# Patient Record
Sex: Male | Born: 1975 | Race: White | Hispanic: No | Marital: Married | Smoking: Never smoker
Health system: Southern US, Community
[De-identification: ages and names within clinical notes are randomized; demographics above are authoritative.]

## PROBLEM LIST (undated history)

## (undated) HISTORY — PX: OTHER SURGICAL HISTORY: SHX169

---

## 2011-09-01 ENCOUNTER — Emergency Department (HOSPITAL_COMMUNITY)
Admission: EM | Admit: 2011-09-01 | Discharge: 2011-09-01 | Disposition: A | Discharge status: Home / Self Care | Payer: BC Managed Care – PPO | Attending: Emergency Medicine | Admitting: Emergency Medicine

## 2011-09-01 ENCOUNTER — Ambulatory Visit (INDEPENDENT_AMBULATORY_CARE_PROVIDER_SITE_OTHER): Payer: BC Managed Care – PPO

## 2011-09-01 ENCOUNTER — Emergency Department (HOSPITAL_COMMUNITY): Payer: BC Managed Care – PPO

## 2011-09-01 ENCOUNTER — Encounter (HOSPITAL_COMMUNITY): Payer: Self-pay | Admitting: Emergency Medicine

## 2011-09-01 DIAGNOSIS — Y9239 Other specified sports and athletic area as the place of occurrence of the external cause: Secondary | ICD-10-CM | POA: Insufficient documentation

## 2011-09-01 DIAGNOSIS — Y92838 Other recreation area as the place of occurrence of the external cause: Secondary | ICD-10-CM | POA: Insufficient documentation

## 2011-09-01 DIAGNOSIS — IMO0002 Reserved for concepts with insufficient information to code with codable children: Secondary | ICD-10-CM | POA: Insufficient documentation

## 2011-09-01 DIAGNOSIS — S0990XA Unspecified injury of head, initial encounter: Secondary | ICD-10-CM

## 2011-09-01 DIAGNOSIS — S0101XA Laceration without foreign body of scalp, initial encounter: Secondary | ICD-10-CM

## 2011-09-01 DIAGNOSIS — S0083XA Contusion of other part of head, initial encounter: Secondary | ICD-10-CM

## 2011-09-01 DIAGNOSIS — S0100XA Unspecified open wound of scalp, initial encounter: Secondary | ICD-10-CM | POA: Insufficient documentation

## 2011-09-01 DIAGNOSIS — Y998 Other external cause status: Secondary | ICD-10-CM | POA: Insufficient documentation

## 2011-09-01 MED ORDER — HYDROCODONE-ACETAMINOPHEN 5-500 MG PO TABS: 1.0000 | ORAL_TABLET | Freq: Four times a day (QID) | ORAL | Status: AC | PRN

## 2011-09-01 MED ORDER — TETANUS-DIPHTH-ACELL PERTUSSIS 5-2.5-18.5 LF-MCG/0.5 IM SUSP
0.5000 mL | Freq: Once | INTRAMUSCULAR | Status: AC
  Administered 2011-09-01: 0.5 mL via INTRAMUSCULAR
  Filled 2011-09-01: qty 0.5

## 2011-09-01 NOTE — ED Notes (Signed)
Per EMS pt transported from [facility] after hitting head on metal bar, significant lac noted just into hairline. Bleeding controlled. Pt denies LOC, denies n/v.

## 2011-09-01 NOTE — ED Notes (Signed)
Pt ambulatory to CT with steady gait.

## 2011-09-01 NOTE — ED Provider Notes (Signed)
History     CSN: [id number]  Arrival date & time 09/01/11  1612   First MD Initiated Contact with Patient 09/01/11 1650      Chief Complaint  Patient presents with  . Head Laceration    (Consider location/radiation/quality/duration/timing/severity/associated sxs/prior treatment) Patient is a 36 y.o. male presenting with head injury. The history is provided by the patient.  Head Injury  The incident occurred 3 to 5 hours ago. He came to the ER via EMS. The injury mechanism was a direct blow. There was no loss of consciousness. The volume of blood lost was minimal. The quality of the pain is described as dull. The pain is at a severity of 7/10. Pertinent negatives include no numbness and no weakness.  States was playing at a playground with his sone when he hit his head on a monkey bar when stood up. States at that time, severe headache, blurred vision for 2 seconds, laceration and bleeding to the top of the head. Pt States his wife applied dressing that they happened to have in their first aid kit in the car and went to [facility]. At urgent care, pt's injury seemed complex and he was sent here for CT and wound repair. Pt states by the time he got here, his headache is improving, denies visual changes, nausea, vomiting, dizziness. Laceration hemostatic.   History reviewed. No pertinent past medical history.  History reviewed. No pertinent past surgical history.  History reviewed. No pertinent family history.  History  Substance Use Topics  . Smoking status: Never Smoker   . Smokeless tobacco: Not on file  . Alcohol Use: Yes     occasional      Review of Systems  Constitutional: Negative for fever and chills.  Eyes: Negative for visual disturbance.  Respiratory: Negative.   Cardiovascular: Negative.   Gastrointestinal: Negative.   Genitourinary: Negative.   Musculoskeletal: Negative.   Skin: Negative.   Neurological: Positive for headaches. Negative for dizziness, seizures,  syncope, weakness and numbness.  Psychiatric/Behavioral: Negative.     Allergies  Review of patient's allergies indicates no known allergies.  Home Medications  No current outpatient prescriptions on file.  BP 132/74  Pulse 91  Temp(Src) 98.1 F (36.7 C) (Oral)  Resp 16  SpO2 100%  Physical Exam  Nursing note and vitals reviewed. Constitutional: He is oriented to person, place, and time. He appears well-developed and well-nourished. No distress.  HENT:  Head: Normocephalic.  Right Ear: External ear normal.  Left Ear: External ear normal.  Nose: Nose normal.       15cm flap laceration to the top of the scalp, just behind the hair line, hemostatic  Eyes: Conjunctivae and EOM are normal. Pupils are equal, round, and reactive to light.  Neck: Normal range of motion. Neck supple.  Cardiovascular: Normal rate, regular rhythm and normal heart sounds.   Pulmonary/Chest: Effort normal and breath sounds normal. No respiratory distress.  Abdominal: Soft. Bowel sounds are normal. He exhibits no distension.  Musculoskeletal: Normal range of motion.  Neurological: He is alert and oriented to person, place, and time. He has normal reflexes. No cranial nerve deficit. He exhibits normal muscle tone. Coordination normal.  Skin: Skin is warm and dry.  Psychiatric: He has a normal mood and affect.    ED Course  Procedures (including critical care time)  CT head obtained and is negative. Laceration repaired with stapling after irrigating with spray and syringe with 1L of NS and safe clense. Pt states pain is  minimal. He is ambulatory. Will d/c home.   No results found for this or any previous visit. Ct Head Wo Contrast  09/01/2011  *RADIOLOGY REPORT*  Clinical Data: Blow to the top of the head, laceration.  CT HEAD WITHOUT CONTRAST  Technique:  Contiguous axial images were obtained from the base of the skull through the vertex without contrast.  Comparison: None.  Findings: Laceration is  identified over the frontal bone.  No underlying fracture or radiopaque foreign body is identified.  The brain appears normal without evidence of acute infarction, hemorrhage, mass lesion, mass effect, midline shift or abnormal extra-axial fluid collection.  No hydrocephalus or pneumocephalus. There is some ethmoid air cell disease on the right.  IMPRESSION: Scalp laceration without underlying fracture.  No acute intracranial abnormality.  Original Report Authenticated By: Bernadene Bell. Maricela Curet, M.D.    LACERATION REPAIR Performed by: Lottie Mussel Authorized by: Jaynie Crumble A Consent: Verbal consent obtained. Risks and benefits: risks, benefits and alternatives were discussed Consent given by: patient Patient identity confirmed: provided demographic data Prepped and Draped in normal sterile fashion Wound explored  Laceration Location: top of the scalp  Laceration Length: 15cm  No Foreign Bodies seen or palpated  Anesthesia: local infiltration  Local anesthetic: lidocaine 2% w/ epinephrine  Anesthetic total: 6 ml  Irrigation method: syringe Amount of cleaning: extensive  Skin closure: Staples  Number of staples: 21    Patient tolerance: Patient tolerated the procedure well with no immediate complications.     MDM          Lottie Mussel, PA 09/01/11 2001  Lottie Mussel, PA 09/01/11 2001

## 2011-09-01 NOTE — ED Notes (Signed)
Suture Cart is at bedside. Pt. Has no needs at this time.

## 2011-09-01 NOTE — ED Notes (Signed)
PA at bedside for suturing 

## 2011-09-01 NOTE — ED Notes (Signed)
[facility]. Pt states @ 1430 he was running on playground with son, pt attempted to run under metal bar, hitting top of head on bar. U shaped laceration to scalp, bleeding controlled. Denies LOC.

## 2011-09-04 NOTE — ED Provider Notes (Signed)
Medical screening examination/treatment/procedure(s) were performed by non-physician practitioner and as supervising physician I was immediately available for consultation/collaboration.   Shelda Jakes, MD 09/04/11 2055

## 2011-09-11 ENCOUNTER — Ambulatory Visit (INDEPENDENT_AMBULATORY_CARE_PROVIDER_SITE_OTHER): Payer: BC Managed Care – PPO | Admitting: Internal Medicine

## 2011-09-11 VITALS — BP 108/70 | HR 72 | Temp 98.1°F | Resp 16 | Ht 69.0 in | Wt 157.0 lb

## 2011-09-11 DIAGNOSIS — S0100XA Unspecified open wound of scalp, initial encounter: Secondary | ICD-10-CM | POA: Insufficient documentation

## 2011-09-11 NOTE — Progress Notes (Signed)
pat Subjective:    Patient ID: Dale Webster, male    DOB: [birth year], 36 y.o.   MRN: [id number]  Suture / Staple Removal The sutures were placed 7 to 10 days ago. He tried antibiotic ointment use since the wound repair. The treatment provided significant relief. His temperature was unmeasured prior to arrival. There has been no drainage from the wound. There is no redness present. There is no swelling present. The pain has improved. He has no difficulty moving the affected extremity or digit.      Review of Systems  Constitutional: Negative.   HENT: Negative.   Eyes: Negative.   Respiratory: Negative.   Skin: Positive for wound.       Objective:   Physical Exam  Constitutional: He appears well-developed and well-nourished.  HENT:  Head: Normocephalic.  Eyes: Pupils are equal, round, and reactive to light.  Neck: Neck supple.  Skin: Skin is warm and dry.          Assessment & Plan:  Scalp wound itches at times, but no significant pain.  Mr. Burley states he never took Vicodin for pain.  He has struck his head twice since his injury, once a small amount of bleeding occurred in the middle portion of his wound.  It remains tender if touched but pain has gradually decreased everyday.

## 2011-09-11 NOTE — Patient Instructions (Signed)
Patient advised to very gently wash area with shampoo and warm water when bathing.  Avoid any re-injury.  RTC prn.

## 2011-10-10 ENCOUNTER — Encounter: Payer: Self-pay | Admitting: *Deleted

## 2011-10-11 ENCOUNTER — Ambulatory Visit: Payer: BC Managed Care – PPO | Admitting: Family Medicine

## 2011-10-25 ENCOUNTER — Ambulatory Visit: Payer: BC Managed Care – PPO | Admitting: Family Medicine

## 2011-11-22 ENCOUNTER — Encounter: Payer: Self-pay | Admitting: Family Medicine

## 2011-11-22 ENCOUNTER — Ambulatory Visit (INDEPENDENT_AMBULATORY_CARE_PROVIDER_SITE_OTHER): Payer: BC Managed Care – PPO | Admitting: Family Medicine

## 2011-11-22 VITALS — BP 114/66 | HR 74 | Temp 97.2°F | Resp 16 | Ht 68.0 in | Wt 150.8 lb

## 2011-11-22 DIAGNOSIS — Z0000 Encounter for general adult medical examination without abnormal findings: Secondary | ICD-10-CM

## 2011-11-22 DIAGNOSIS — B360 Pityriasis versicolor: Secondary | ICD-10-CM

## 2011-11-22 DIAGNOSIS — D229 Melanocytic nevi, unspecified: Secondary | ICD-10-CM

## 2011-11-22 DIAGNOSIS — Z79899 Other long term (current) drug therapy: Secondary | ICD-10-CM

## 2011-11-22 MED ORDER — KETOCONAZOLE 200 MG PO TABS: 400.0000 mg | ORAL_TABLET | Freq: Once | ORAL | Status: AC

## 2011-11-22 NOTE — Progress Notes (Signed)
  Subjective:    Patient ID: Dale Webster, male    DOB: March 03, 1976, 36 y.o.   MRN: [id number]  HPI Dale Webster is a 36 y.o. male  CPE - see PHS for details. Feels pretty healthy.   Hx of moles in past biopsied, as sister with hx melanoma.   Last derm eval 1 and 1.2 years ago.  No new moles.  Hx of tinea versicolor - initially on arms 2 years ago, now on more areas- back chest, neck, shoulders past 6- 8 months.  Tried otc selsun blue.  Energy auditor. NOnsmoker.  No other med problems.  Notices toenaill fungus for years. no treatments.   Review of Systems See scanned copy of PHS - 13 point ROS on PHS.     Objective:   Physical Exam  Constitutional: He is oriented to person, place, and time. He appears well-developed and well-nourished.  HENT:  Head: Normocephalic and atraumatic.  Right Ear: External ear normal.  Left Ear: External ear normal.  Mouth/Throat: Oropharynx is clear and moist.  Eyes: Conjunctivae and EOM are normal. Pupils are equal, round, and reactive to light.  Neck: Normal range of motion. Neck supple. No thyromegaly present.  Cardiovascular: Normal rate, regular rhythm, normal heart sounds and intact distal pulses.   Pulmonary/Chest: Effort normal and breath sounds normal. No respiratory distress. He has no wheezes.  Abdominal: Soft. He exhibits no distension. There is no tenderness. Hernia confirmed negative in the right inguinal area and confirmed negative in the left inguinal area.  Genitourinary: Penis normal. Right testis shows no mass. Left testis shows no mass.  Musculoskeletal: Normal range of motion. He exhibits no edema and no tenderness.  Lymphadenopathy:    He has no cervical adenopathy.  Neurological: He is alert and oriented to person, place, and time. He has normal reflexes.  Skin: Skin is warm and dry.          Diffuse hyperpigmented patches - shoulders, arms, trunk.    Psychiatric: He has a normal mood and affect. His behavior is normal.           Assessment & Plan:  [name] is a 36 y.o. male Annual exam - no acute findings;  Check CMP, lipids, CBC.  Plan on PSA at 36yo with FH prostate CA, see scanned form.  tinea versicolor - Rx Nizoral 200mg  - 2 po x 1, sweat with exercise 1 hour after taking med, then wait to shower for 1-2hours.  rf x 1 to repeat in 3-4 weeks if not improved.  Phone numbers for dentist and optho.  Refer to derm with FH melanoma.

## 2011-11-22 NOTE — Patient Instructions (Addendum)
Dentistry:  [facility] [phone]          Opthalmology: [facility] [phone]                                Dr.Groat  [phone]                                Dr. Hazle Quant [phone]    Peds Dentist: Dr Nicholes Rough [address] Sherian Maroon  [phone]        We will refer you to dermatology.  Take Nizoral - 2 pills at once, sweat with exercise 1 hour after taking med, then wait to shower for 1-2hours. Can repeat dose in 3-4 weeks if needed.  .Return to the clinic or go to the nearest emergency room if any of your symptoms worsen or new symptoms occur.

## 2011-11-23 LAB — CBC
HCT: 41.6 % (ref 39.0–52.0)
Hemoglobin: 13.7 g/dL (ref 13.0–17.0)
RBC: 4.81 MIL/uL (ref 4.22–5.81)
WBC: 5.5 10*3/uL (ref 4.0–10.5)

## 2011-11-23 LAB — COMPREHENSIVE METABOLIC PANEL
ALT: 30 U/L (ref 0–53)
AST: 30 U/L (ref 0–37)
Albumin: 4.9 g/dL (ref 3.5–5.2)
Alkaline Phosphatase: 58 U/L (ref 39–117)
Glucose, Bld: 78 mg/dL (ref 70–99)
Potassium: 3.6 mEq/L (ref 3.5–5.3)
Sodium: 141 mEq/L (ref 135–145)
Total Protein: 6.7 g/dL (ref 6.0–8.3)

## 2011-11-23 LAB — LIPID PANEL
LDL Cholesterol: 76 mg/dL (ref 0–99)
VLDL: 8 mg/dL (ref 0–40)

## 2013-02-23 IMAGING — CT CT HEAD W/O CM
2 series · 17 of 30 positions shown, 20 images · non-contrast
Comparison: None.

CLINICAL DATA: Blow to the top of the head, laceration.

CT HEAD WITHOUT CONTRAST
TECHNIQUE: Contiguous axial images were obtained from the base of
the skull through the vertex without contrast.

[Series 2: head w/o · axial · non-contrast · 0.47mm/px · z∈[+1090,+1210]mm · 9 of 31 slices shown, 12 images]
[im 4/31  brain]
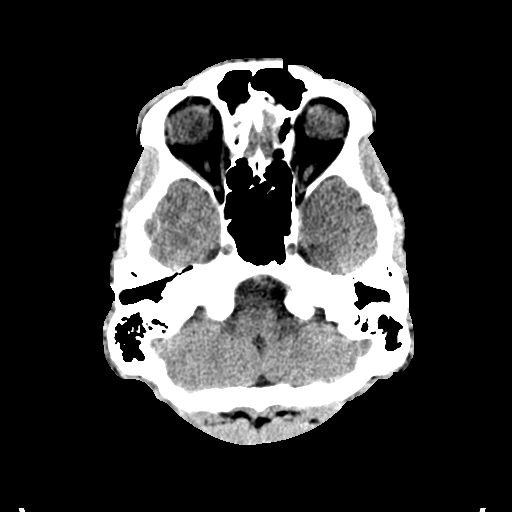
[im 4/31  bone]
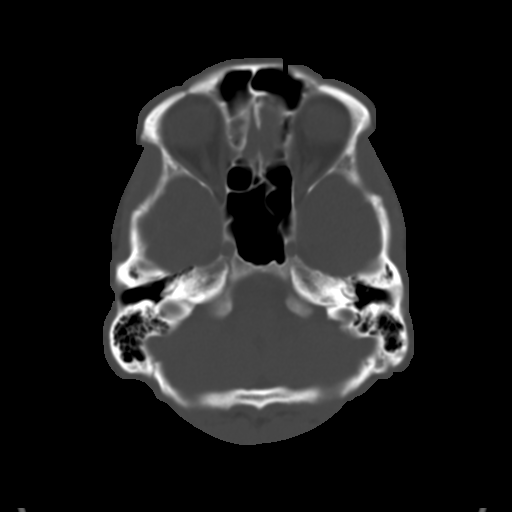
[im 7/31  brain]
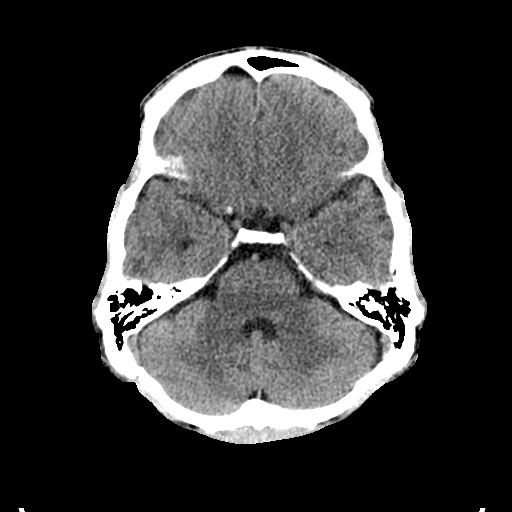
[im 10/31  brain]
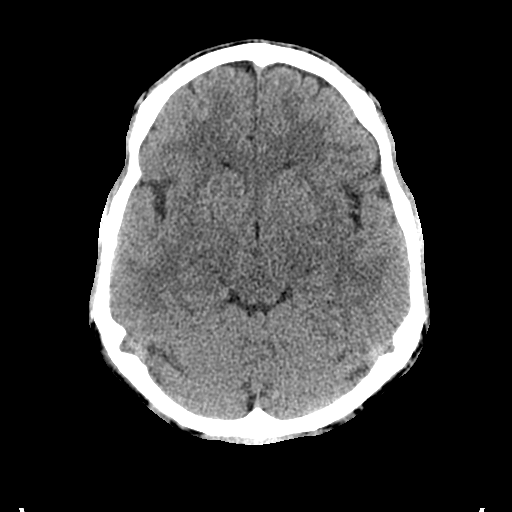
[im 13/31  brain]
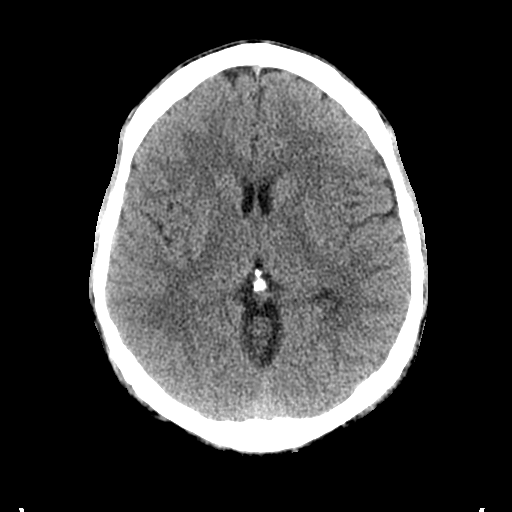
[im 16/31  brain]
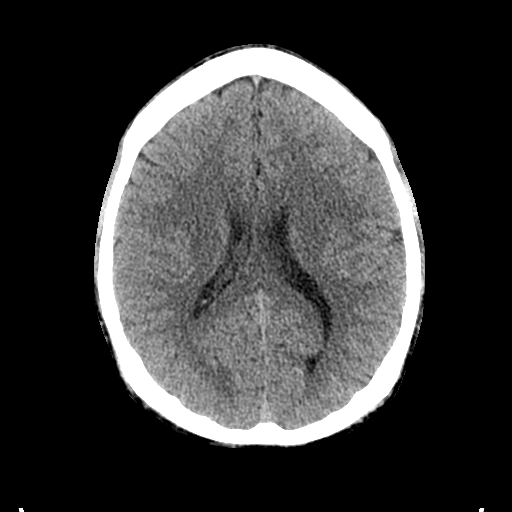
[im 16/31  bone]
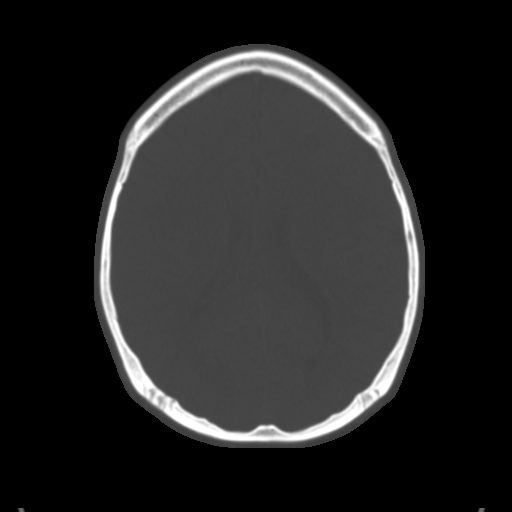
[im 19/31  brain]
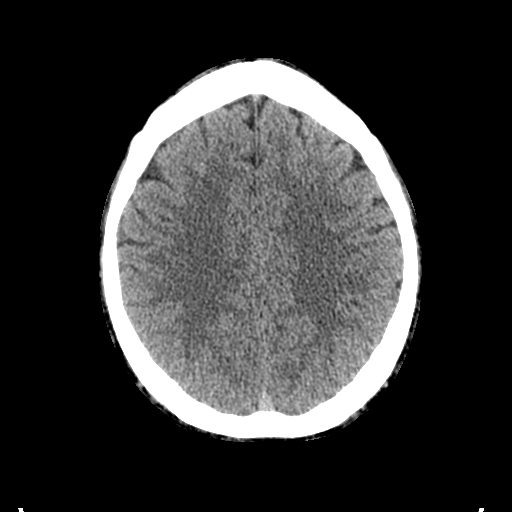
[im 22/31  brain]
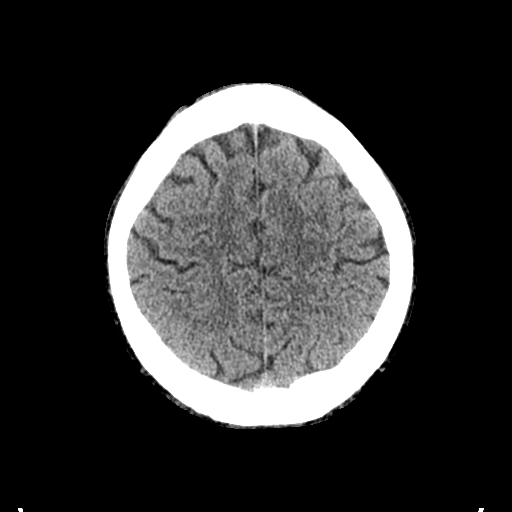
[im 25/31  brain]
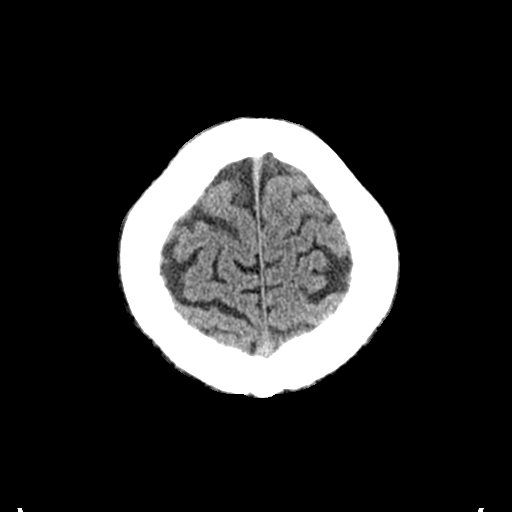
[im 28/31  brain]
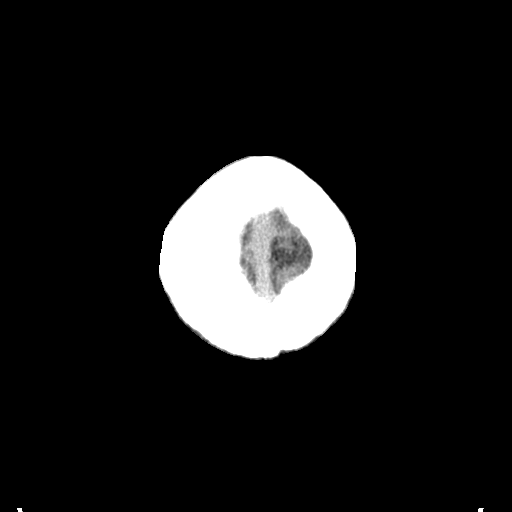
[im 28/31  bone]
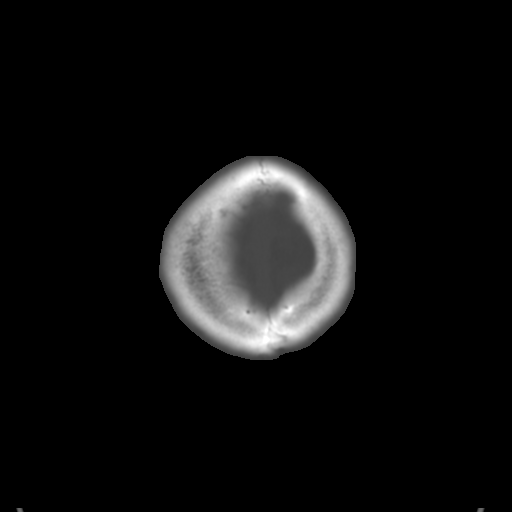

[Series 3: bone windows · axial · 0.47mm/px · z∈[+1090,+1210]mm · 8 of 52 slices shown]
[im 6/52  bone]
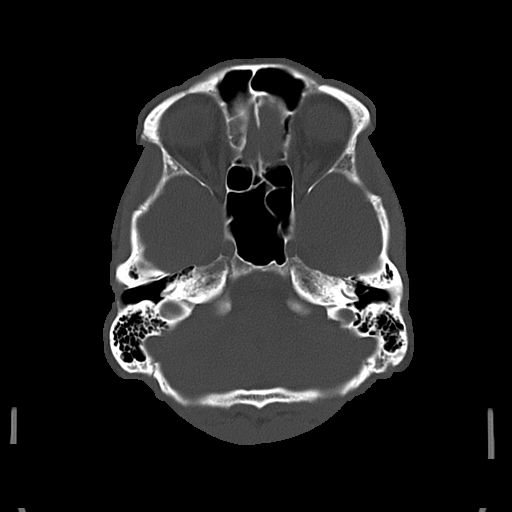
[im 12/52  bone]
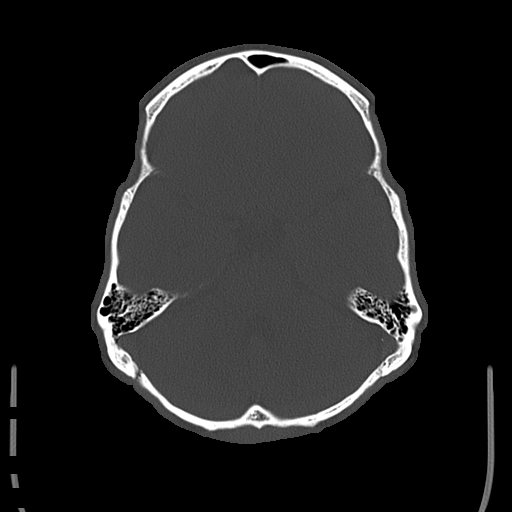
[im 18/52  bone]
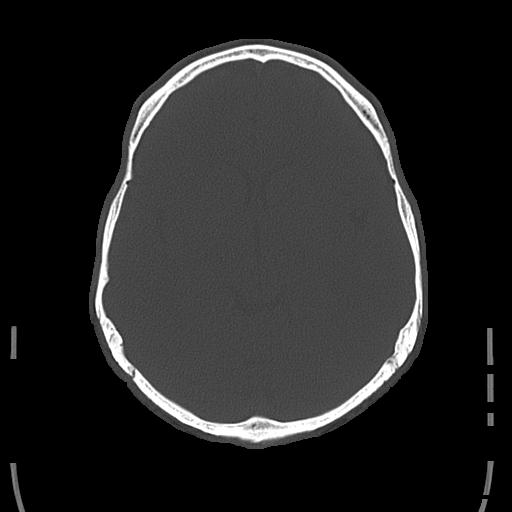
[im 23/52  bone]
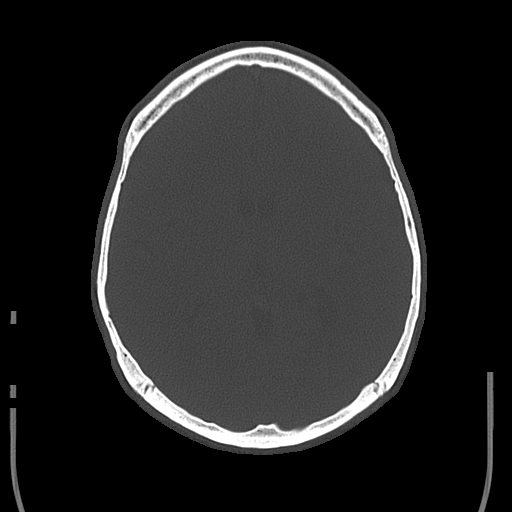
[im 29/52  bone]
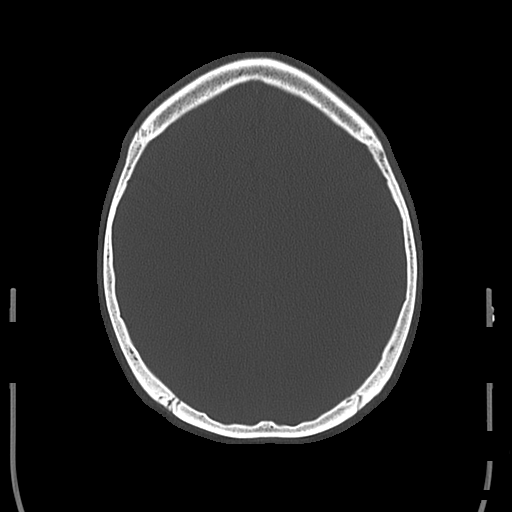
[im 35/52  bone]
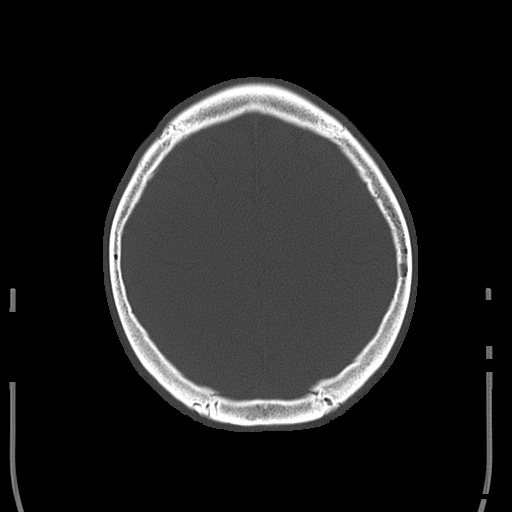
[im 40/52  bone]
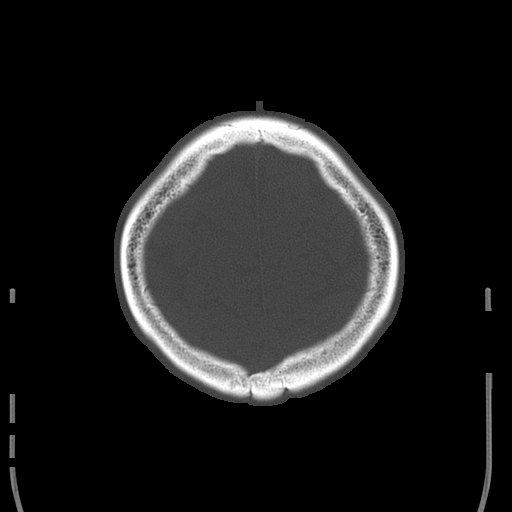
[im 46/52  bone]
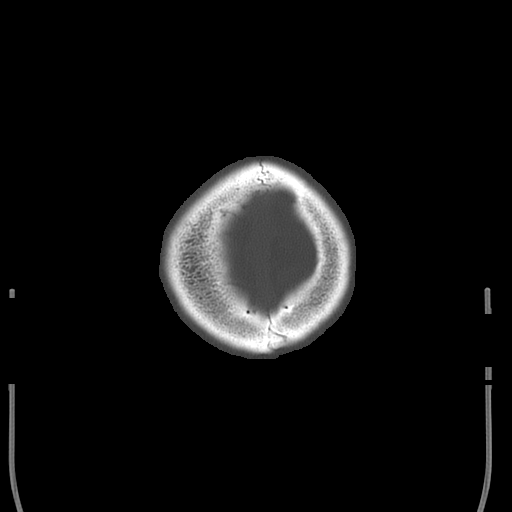

[17 of 30 positions shown; findings below may reference images not displayed]

FINDINGS: Laceration is identified over the frontal bone.  No
underlying fracture or radiopaque foreign body is identified.  The
brain appears normal without evidence of acute infarction,
hemorrhage, mass lesion, mass effect, midline shift or abnormal
extra-axial fluid collection.  No hydrocephalus or pneumocephalus.
There is some ethmoid air cell disease on the right.
IMPRESSION: Scalp laceration without underlying fracture.  No acute
intracranial abnormality.

## 2016-07-26 ENCOUNTER — Ambulatory Visit (INDEPENDENT_AMBULATORY_CARE_PROVIDER_SITE_OTHER): Payer: BLUE CROSS/BLUE SHIELD | Admitting: Family Medicine

## 2016-07-26 VITALS — BP 104/54 | HR 72 | Temp 98.2°F | Resp 16 | Ht 68.0 in | Wt 157.0 lb

## 2016-07-26 DIAGNOSIS — Z13220 Encounter for screening for lipoid disorders: Secondary | ICD-10-CM | POA: Diagnosis not present

## 2016-07-26 DIAGNOSIS — Z0000 Encounter for general adult medical examination without abnormal findings: Secondary | ICD-10-CM

## 2016-07-26 DIAGNOSIS — L729 Follicular cyst of the skin and subcutaneous tissue, unspecified: Secondary | ICD-10-CM | POA: Diagnosis not present

## 2016-07-26 DIAGNOSIS — F458 Other somatoform disorders: Secondary | ICD-10-CM

## 2016-07-26 DIAGNOSIS — R0989 Other specified symptoms and signs involving the circulatory and respiratory systems: Secondary | ICD-10-CM

## 2016-07-26 NOTE — Progress Notes (Signed)
Subjective:  By signing my name below, I, Essence Howell, attest that this documentation has been prepared under the direction and in the presence of Wendie Agreste, MD Electronically Signed: Ladene Artist, ED Scribe 07/26/2016 at 9:10 AM   Patient ID: Dale Webster, male    DOB: [birth year], 40 y.o.   MRN: [id number]  Chief Complaint  Patient presents with  . Annual Exam   HPI HPI Comments: Dale Webster is a 40 y.o. male who presents to the [facility] for an annual exam. Last CPE was April 2013.   Pt states that he was experiencing an "electrical pulse" that occurred every 10 seconds in his left leg. He states that it felt like the pulse was occurring in his muscle but resolved yesterday.   Pt also presents with a slightly enlarged bump in his scalp first noticed 8-10 years but has slightly shifted upward. Pt states that he has not seen his dermatologist, Lennie Odor with [facility], in a while.   Pt also reports occasional globus sensation that occurs more often while eating fast and eating carbohydrates. He states that he occasionally has noticed the sensation even after reducing his speed. He describes the globus sensation as non-radiating and states that he last experienced it approximately 6 months ago accompanied with some nausea. Pt denies diaphoresis and vomiting. No family h/o heart disease.   CA Screening Had moles biopsied previously as his sister has had melanoma. Father has a h/o prostate CA at 27 y.o.  Immunizations Immunization History  Administered Date(s) Administered  . Tdap 09/01/2011  Pt has not received a flu vaccine this season.   Depression Screening  Depression screen [facility] 2/9 07/26/2016  Decreased Interest 0  Down, Depressed, Hopeless 0  PHQ - 2 Score 0   Vision Screening  Visual Acuity Screening   Right eye Left eye Both eyes  Without correction:     With correction: 20/15 20/15 20/15   Pt last had an eye exam last  year.   He is UTD on his dental exam.  Exercise Pt walks his dog at least twice daily and reports going up and down a ladder often at work.   STD Screening Pt declined STD screening at this visit.   Lipid Screening Lab Results  Component Value Date   CHOL 131 11/22/2011   HDL 47 11/22/2011   LDLCALC 76 11/22/2011   TRIG 39 11/22/2011   CHOLHDL 2.8 11/22/2011   Patient Active Problem List   Diagnosis Date Noted  . Scalp wound 09/11/2011   History reviewed. No pertinent past medical history. Past Surgical History:  Procedure Laterality Date  . mole biopsy     benign   Not on File Prior to Admission medications   Not on File   Social History   Social History  . Marital status: Married    Spouse name: N/A  . Number of children: N/A  . Years of education: N/A   Occupational History  . energy analyst    Social History Main Topics  . Smoking status: Never Smoker  . Smokeless tobacco: Never Used  . Alcohol use Yes     Comment: occasional  . Drug use: No  . Sexual activity: Not on file   Other Topics Concern  . Not on file   Social History Narrative  . No narrative on file   Review of Systems  All other systems reviewed and are negative.     Objective:   Physical  Exam  Constitutional: He is oriented to person, place, and time. He appears well-developed and well-nourished.  HENT:  Head: Normocephalic and atraumatic.  Right Ear: External ear normal.  Left Ear: External ear normal.  Mouth/Throat: Oropharynx is clear and moist.  Mobile cystic structure at the posterior R scalp.   Eyes: Conjunctivae and EOM are normal. Pupils are equal, round, and reactive to light.  Neck: Normal range of motion. Neck supple. No thyromegaly present.  Cardiovascular: Normal rate, regular rhythm, normal heart sounds and intact distal pulses.   Pulmonary/Chest: Effort normal and breath sounds normal. No respiratory distress. He has no wheezes.  Abdominal: Soft. He exhibits no  distension. There is no tenderness.  Musculoskeletal: Normal range of motion. He exhibits no edema or tenderness.  Lymphadenopathy:    He has no cervical adenopathy.  Neurological: He is alert and oriented to person, place, and time. He has normal reflexes.  Skin: Skin is warm and dry.  Multiple scatted nevi on back and upper chest wall.   Psychiatric: He has a normal mood and affect. His behavior is normal.  Vitals reviewed.  Vitals:   07/26/16 0854  BP: (!) 104/54  Pulse: 72  Resp: 16  Temp: 98.2 F (36.8 C)  TempSrc: Oral  SpO2: 100%  Weight: 157 lb (71.2 kg)  Height: 5' 8"  (1.727 m)      Assessment & Plan:   Dale Webster is a 40 y.o. male Annual physical exam  --anticipatory guidance as below in AVS, screening labs above. Health maintenance items as above in HPI discussed/recommended as applicable.   -Consider PSA/prostate screening between 16 and 60 given family history of prostate cancer.  -Previous leg symptoms may have been meralgia paresthetica, but asymptomatic currently. Return if symptoms recur.   Screening for hyperlipidemia - Plan: Comprehensive metabolic panel, Lipid panel  Globus sensation  - Possible reflux versus eating too fast. Avoidance of trigger foods, then RTC precautions if persistent symptoms. Sooner if worse.  Scalp cyst  -Follow-up with dermatologist for routine skin check with family history of skin cancer, and they can also evaluate scalp cyst for possible excision.  No orders of the defined types were placed in this encounter.  Patient Instructions   See foods to avoid below that may increase heartburn symptoms. If you experience these symptoms in the chest again, return to discuss further.  Return to the clinic or go to the nearest emergency room if any of your symptoms worsen or new symptoms occur.  I will check some electrolytes for the spasm or sensation you had in your leg, but no concerns on exam today.  Call your dermatologist, at  [facility] for appointment. They can also look at the cyst on your scalp to discuss removal.  We can discuss prostate cancer screening starting either next year or age 3 due to your family history.  Return to the clinic or go to the nearest emergency room if any of your symptoms worsen or new symptoms occur.  Food Choices for Gastroesophageal Reflux Disease, Adult When you have gastroesophageal reflux disease (GERD), the foods you eat and your eating habits are very important. Choosing the right foods can help ease the discomfort of GERD. What general guidelines do I need to follow?  Choose fruits, vegetables, whole grains, low-fat dairy products, and low-fat meat, fish, and poultry.  Limit fats such as oils, salad dressings, butter, nuts, and avocado.  Keep a food diary to identify foods that cause symptoms.  Avoid foods that  cause reflux. These may be different for different people.  Eat frequent small meals instead of three large meals each day.  Eat your meals slowly, in a relaxed setting.  Limit fried foods.  Cook foods using methods other than frying.  Avoid drinking alcohol.  Avoid drinking large amounts of liquids with your meals.  Avoid bending over or lying down until 2-3 hours after eating. What foods are not recommended? The following are some foods and drinks that may worsen your symptoms: Vegetables  Tomatoes. Tomato juice. Tomato and spaghetti sauce. Chili peppers. Onion and garlic. Horseradish. Fruits  Oranges, grapefruit, and lemon (fruit and juice). Meats  High-fat meats, fish, and poultry. This includes hot dogs, ribs, ham, sausage, salami, and bacon. Dairy  Whole milk and chocolate milk. Sour cream. Cream. Butter. Ice cream. Cream cheese. Beverages  Coffee and tea, with or without caffeine. Carbonated beverages or energy drinks. Condiments  Hot sauce. Barbecue sauce. Sweets/Desserts  Chocolate and cocoa. Donuts. Peppermint and  spearmint. Fats and Oils  High-fat foods, including [place] fries and potato chips. Other  Vinegar. Strong spices, such as black pepper, white pepper, red pepper, cayenne, curry powder, cloves, ginger, and chili powder. The items listed above may not be a complete list of foods and beverages to avoid. Contact your dietitian for more information.  This information is not intended to replace advice given to you by your health care provider. Make sure you discuss any questions you have with your health care provider. Document Released: 07/29/2005 Document Revised: 01/04/2016 Document Reviewed: 06/02/2013 Elsevier Interactive Patient Education  2017 [place] you healthy  Get these tests  Blood pressure- Have your blood pressure checked once a year by your healthcare provider.  Normal blood pressure is 120/80.  Weight- Have your body mass index (BMI) calculated to screen for obesity.  BMI is a measure of body fat based on height and weight. You can also calculate your own BMI at [employer]velBags.it.  Cholesterol- Have your cholesterol checked regularly starting at age 29, sooner may be necessary if you have diabetes, high blood pressure, if a family member developed heart diseases at an early age or if you smoke.   Chlamydia, HIV, and other sexual transmitted disease- Get screened each year until the age of 47 then within three months of each new sexual partner.  Diabetes- Have your blood sugar checked regularly if you have high blood pressure, high cholesterol, a family history of diabetes or if you are overweight.  Get these vaccines  Flu shot- Every fall.  Tetanus shot- Every 10 years.  Menactra- Single dose; prevents meningitis.  Take these steps  Don't smoke- If you do smoke, ask your healthcare provider about quitting. For tips on how to quit, go to www.smokefree.gov or call 1-800-QUIT-NOW.  Be physically active- Exercise 5 days a week for at least 30 minutes.   If you are not already physically active start slow and gradually work up to 30 minutes of moderate physical activity.  Examples of moderate activity include walking briskly, mowing the yard, dancing, swimming bicycling, etc.  Eat a healthy diet- Eat a variety of healthy foods such as fruits, vegetables, low fat milk, low fat cheese, yogurt, lean meats, poultry, fish, beans, tofu, etc.  For more information on healthy eating, go to www.thenutritionsource.org  Drink alcohol in moderation- Limit alcohol intake two drinks or less a day.  Never drink and drive.  Dentist- Brush and floss teeth twice daily; visit your dentis twice a year.  Depression-Your emotional health is as important as your physical health.  If you're feeling down, losing interest in things you normally enjoy please talk with your healthcare provider.  Gun Safety- If you keep a gun in your home, keep it unloaded and with the safety lock on.  Bullets should be stored separately.  Helmet use- Always wear a helmet when riding a motorcycle, bicycle, rollerblading or skateboarding.  Safe sex- If you may be exposed to a sexually transmitted infection, use a condom  Seat belts- Seat bels can save your life; always wear one.  Smoke/Carbon Monoxide detectors- These detectors need to be installed on the appropriate level of your home.  Replace batteries at least once a year.  Skin Cancer- When out in the sun, cover up and use sunscreen SPF 15 or higher.  Violence- If anyone is threatening or hurting you, please tell your healthcare provider.   IF you received an x-ray today, you will receive an invoice from [facility]. Please contact [facility] at [phone] with questions or concerns regarding your invoice.   IF you received labwork today, you will receive an invoice from [place]. Please contact [facility] at [phone] with questions or concerns regarding your invoice.   Our billing staff will not be able  to assist you with questions regarding bills from these companies.  You will be contacted with the lab results as soon as they are available. The fastest way to get your results is to activate your My Chart account. Instructions are located on the last page of this paperwork. If you have not heard from [place] regarding the results in 2 weeks, please contact this office.        I personally performed the services described in this documentation, which was scribed in my presence. The recorded information has been reviewed and considered, and addended by me as needed.   Signed,   Merri Ray, MD [facility].  07/27/16 8:07 PM

## 2016-07-26 NOTE — Patient Instructions (Addendum)
See foods to avoid below that may increase heartburn symptoms. If you experience these symptoms in the chest again, return to discuss further.  Return to the clinic or go to the nearest emergency room if any of your symptoms worsen or new symptoms occur.  I will check some electrolytes for the spasm or sensation you had in your leg, but no concerns on exam today.  Call your dermatologist, at [facility] for appointment. They can also look at the cyst on your scalp to discuss removal.  We can discuss prostate cancer screening starting either next year or age 39 due to your family history.  Return to the clinic or go to the nearest emergency room if any of your symptoms worsen or new symptoms occur.  Food Choices for Gastroesophageal Reflux Disease, Adult When you have gastroesophageal reflux disease (GERD), the foods you eat and your eating habits are very important. Choosing the right foods can help ease the discomfort of GERD. What general guidelines do I need to follow?  Choose fruits, vegetables, whole grains, low-fat dairy products, and low-fat meat, fish, and poultry.  Limit fats such as oils, salad dressings, butter, nuts, and avocado.  Keep a food diary to identify foods that cause symptoms.  Avoid foods that cause reflux. These may be different for different people.  Eat frequent small meals instead of three large meals each day.  Eat your meals slowly, in a relaxed setting.  Limit fried foods.  Cook foods using methods other than frying.  Avoid drinking alcohol.  Avoid drinking large amounts of liquids with your meals.  Avoid bending over or lying down until 2-3 hours after eating. What foods are not recommended? The following are some foods and drinks that may worsen your symptoms: Vegetables  Tomatoes. Tomato juice. Tomato and spaghetti sauce. Chili peppers. Onion and garlic. Horseradish. Fruits  Oranges, grapefruit, and lemon (fruit and juice). Meats   High-fat meats, fish, and poultry. This includes hot dogs, ribs, ham, sausage, salami, and bacon. Dairy  Whole milk and chocolate milk. Sour cream. Cream. Butter. Ice cream. Cream cheese. Beverages  Coffee and tea, with or without caffeine. Carbonated beverages or energy drinks. Condiments  Hot sauce. Barbecue sauce. Sweets/Desserts  Chocolate and cocoa. Donuts. Peppermint and spearmint. Fats and Oils  High-fat foods, including [place] fries and potato chips. Other  Vinegar. Strong spices, such as black pepper, white pepper, red pepper, cayenne, curry powder, cloves, ginger, and chili powder. The items listed above may not be a complete list of foods and beverages to avoid. Contact your dietitian for more information.  This information is not intended to replace advice given to you by your health care provider. Make sure you discuss any questions you have with your health care provider. Document Released: 07/29/2005 Document Revised: 01/04/2016 Document Reviewed: 06/02/2013 Elsevier Interactive Patient Education  2017 [place] you healthy  Get these tests  Blood pressure- Have your blood pressure checked once a year by your healthcare provider.  Normal blood pressure is 120/80.  Weight- Have your body mass index (BMI) calculated to screen for obesity.  BMI is a measure of body fat based on height and weight. You can also calculate your own BMI at [employer]velBags.it.  Cholesterol- Have your cholesterol checked regularly starting at age 70, sooner may be necessary if you have diabetes, high blood pressure, if a family member developed heart diseases at an early age or if you smoke.   Chlamydia, HIV, and other sexual transmitted disease- Get  screened each year until the age of 64 then within three months of each new sexual partner.  Diabetes- Have your blood sugar checked regularly if you have high blood pressure, high cholesterol, a family history of diabetes or if  you are overweight.  Get these vaccines  Flu shot- Every fall.  Tetanus shot- Every 10 years.  Menactra- Single dose; prevents meningitis.  Take these steps  Don't smoke- If you do smoke, ask your healthcare provider about quitting. For tips on how to quit, go to www.smokefree.gov or call 1-800-QUIT-NOW.  Be physically active- Exercise 5 days a week for at least 30 minutes.  If you are not already physically active start slow and gradually work up to 30 minutes of moderate physical activity.  Examples of moderate activity include walking briskly, mowing the yard, dancing, swimming bicycling, etc.  Eat a healthy diet- Eat a variety of healthy foods such as fruits, vegetables, low fat milk, low fat cheese, yogurt, lean meats, poultry, fish, beans, tofu, etc.  For more information on healthy eating, go to www.thenutritionsource.org  Drink alcohol in moderation- Limit alcohol intake two drinks or less a day.  Never drink and drive.  Dentist- Brush and floss teeth twice daily; visit your dentis twice a year.  Depression-Your emotional health is as important as your physical health.  If you're feeling down, losing interest in things you normally enjoy please talk with your healthcare provider.  Gun Safety- If you keep a gun in your home, keep it unloaded and with the safety lock on.  Bullets should be stored separately.  Helmet use- Always wear a helmet when riding a motorcycle, bicycle, rollerblading or skateboarding.  Safe sex- If you may be exposed to a sexually transmitted infection, use a condom  Seat belts- Seat bels can save your life; always wear one.  Smoke/Carbon Monoxide detectors- These detectors need to be installed on the appropriate level of your home.  Replace batteries at least once a year.  Skin Cancer- When out in the sun, cover up and use sunscreen SPF 15 or higher.  Violence- If anyone is threatening or hurting you, please tell your healthcare provider.   IF you  received an x-ray today, you will receive an invoice from [facility]. Please contact [facility] at [phone] with questions or concerns regarding your invoice.   IF you received labwork today, you will receive an invoice from [place]. Please contact [facility] at [phone] with questions or concerns regarding your invoice.   Our billing staff will not be able to assist you with questions regarding bills from these companies.  You will be contacted with the lab results as soon as they are available. The fastest way to get your results is to activate your My Chart account. Instructions are located on the last page of this paperwork. If you have not heard from [place] regarding the results in 2 weeks, please contact this office.

## 2016-07-27 LAB — LIPID PANEL
CHOLESTEROL TOTAL: 171 mg/dL (ref 100–199)
Chol/HDL Ratio: 2.9 ratio units (ref 0.0–5.0)
HDL: 60 mg/dL (ref >39)
LDL CALC: 102 mg/dL — AB (ref 0–99)
TRIGLYCERIDES: 45 mg/dL (ref 0–149)
VLDL CHOLESTEROL CAL: 9 mg/dL (ref 5–40)

## 2016-07-27 LAB — COMPREHENSIVE METABOLIC PANEL
ALK PHOS: 56 IU/L (ref 39–117)
ALT: 13 IU/L (ref 0–44)
AST: 18 IU/L (ref 0–40)
Albumin/Globulin Ratio: 2.8 — ABNORMAL HIGH (ref 1.2–2.2)
Albumin: 4.7 g/dL (ref 3.5–5.5)
BUN/Creatinine Ratio: 13 (ref 9–20)
BUN: 14 mg/dL (ref 6–20)
Bilirubin Total: 0.5 mg/dL (ref 0.0–1.2)
CALCIUM: 9.5 mg/dL (ref 8.7–10.2)
CO2: 23 mmol/L (ref 18–29)
CREATININE: 1.09 mg/dL (ref 0.76–1.27)
Chloride: 105 mmol/L (ref 96–106)
GFR calc Af Amer: 98 mL/min/{1.73_m2} (ref >59)
GFR calc non Af Amer: 85 mL/min/{1.73_m2} (ref >59)
GLUCOSE: 92 mg/dL (ref 65–99)
Globulin, Total: 1.7 g/dL (ref 1.5–4.5)
Potassium: 4.3 mmol/L (ref 3.5–5.2)
Sodium: 146 mmol/L — ABNORMAL HIGH (ref 134–144)
Total Protein: 6.4 g/dL (ref 6.0–8.5)

## 2016-08-08 ENCOUNTER — Encounter: Payer: Self-pay | Admitting: *Deleted

## 2020-02-03 ENCOUNTER — Encounter: Payer: Self-pay | Admitting: Family Medicine

## 2020-02-03 ENCOUNTER — Ambulatory Visit: Payer: Commercial Managed Care - PPO | Admitting: Family Medicine

## 2020-02-03 ENCOUNTER — Other Ambulatory Visit: Payer: Self-pay

## 2020-02-03 VITALS — BP 134/72 | HR 87 | Temp 97.9°F | Ht 68.0 in | Wt 156.0 lb

## 2020-02-03 DIAGNOSIS — Z8042 Family history of malignant neoplasm of prostate: Secondary | ICD-10-CM | POA: Diagnosis not present

## 2020-02-03 DIAGNOSIS — Z125 Encounter for screening for malignant neoplasm of prostate: Secondary | ICD-10-CM

## 2020-02-03 DIAGNOSIS — Z131 Encounter for screening for diabetes mellitus: Secondary | ICD-10-CM

## 2020-02-03 DIAGNOSIS — Z13220 Encounter for screening for lipoid disorders: Secondary | ICD-10-CM | POA: Diagnosis not present

## 2020-02-03 DIAGNOSIS — Z0000 Encounter for general adult medical examination without abnormal findings: Secondary | ICD-10-CM

## 2020-02-03 NOTE — Patient Instructions (Addendum)
Keeping you healthy  Get these tests  Blood pressure- Have your blood pressure checked once a year by your healthcare provider.  Normal blood pressure is 120/80.  Weight- Have your body mass index (BMI) calculated to screen for obesity.  BMI is a measure of body fat based on height and weight. You can also calculate your own BMI at [employer]ags.it.  Cholesterol- Have your cholesterol checked regularly starting at age 43, sooner may be necessary if you have diabetes, high blood pressure, if a family member developed heart diseases at an early age or if you smoke.   Chlamydia, HIV, and other sexual transmitted disease- Get screened each year until the age of 50 then within three months of each new sexual partner.  Diabetes- Have your blood sugar checked regularly if you have high blood pressure, high cholesterol, a family history of diabetes or if you are overweight.  Get these vaccines  Flu shot- Every fall.  Tetanus shot- Every 10 years.  Menactra- Single dose; prevents meningitis.  Take these steps  Don't smoke- If you do smoke, ask your healthcare provider about quitting. For tips on how to quit, go to www.smokefree.gov or call 1-800-QUIT-NOW.  Be physically active- Exercise 5 days a week for at least 30 minutes.  If you are not already physically active start slow and gradually work up to 30 minutes of moderate physical activity.  Examples of moderate activity include walking briskly, mowing the yard, dancing, swimming bicycling, etc.  Eat a healthy diet- Eat a variety of healthy foods such as fruits, vegetables, low fat milk, low fat cheese, yogurt, lean meats, poultry, fish, beans, tofu, etc.  For more information on healthy eating, go to www.thenutritionsource.org  Drink alcohol in moderation- Limit alcohol intake two drinks or less a day.  Never drink and drive.  Dentist- Brush and floss teeth twice daily; visit your dentis twice a year.  Depression-Your emotional  health is as important as your physical health.  If you're feeling down, losing interest in things you normally enjoy please talk with your healthcare provider.  Gun Safety- If you keep a gun in your home, keep it unloaded and with the safety lock on.  Bullets should be stored separately.  Helmet use- Always wear a helmet when riding a motorcycle, bicycle, rollerblading or skateboarding.  Safe sex- If you may be exposed to a sexually transmitted infection, use a condom  Seat belts- Seat bels can save your life; always wear one.  Smoke/Carbon Monoxide detectors- These detectors need to be installed on the appropriate level of your home.  Replace batteries at least once a year.  Skin Cancer- When out in the sun, cover up and use sunscreen SPF 15 or higher.  Violence- If anyone is threatening or hurting you, please tell your healthcare provider.    If you have lab work done today you will be contacted with your lab results within the next 2 weeks.  If you have not heard from [place] then please contact us. The fastest way to get your results is to register for My Chart.   IF you received an x-ray today, you will receive an invoice from [facility]. Please contact [facility] at [phone] with questions or concerns regarding your invoice.   IF you received labwork today, you will receive an invoice from [place]. Please contact [facility] at [phone] with questions or concerns regarding your invoice.   Our billing staff will not be able to assist you with questions regarding bills from  these companies.  You will be contacted with the lab results as soon as they are available. The fastest way to get your results is to activate your My Chart account. Instructions are located on the last page of this paperwork. If you have not heard from [place] regarding the results in 2 weeks, please contact this office.

## 2020-02-03 NOTE — Progress Notes (Signed)
Subjective:  Patient ID: Dale Webster, male    DOB: 09-20-1975  Age: 44 y.o. MRN: [id number]  CC:  Chief Complaint  Patient presents with  . Establish Care    as far as pt's general health pt reports he ffeels fine with no complaints. Pt isn't fasting.    HPI Dale Webster presents for   Scheduled to establish care, but I have seen him in the past, most recent visit December 2017 for physical. No specific concerns.  Cancer screening: FH: prostate CA - father at 60yo. Agrees to PSA only. Skin Ca - maternal GF, and sister with malignant melanoma.  DermDenna Haggard. appt - in August.   Immunization History  Administered Date(s) Administered  . Tdap 09/01/2011  covid vaccine - [employer]izer in March.   Depression screen [facility] 2/9 02/03/2020 07/26/2016  Decreased Interest 0 0  Down, Depressed, Hopeless 0 0  PHQ - 2 Score 0 0   No exam data present  Dental: Every 6 months, recent visit.   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 Climbing stairs, ladders. Over 12,000 steps per day.   Declines STI screening.    History Patient Active Problem List   Diagnosis Date Noted  . Scalp wound 09/11/2011   History reviewed. No pertinent past medical history. Past Surgical History:  Procedure Laterality Date  . mole biopsy     benign   No Known Allergies Prior to Admission medications   Not on File   Social History   Socioeconomic History  . Marital status: Married    Spouse name: Not on file  . Number of children: Not on file  . Years of education: Not on file  . Highest education level: Not on file  Occupational History  . Occupation: Leisure centre manager  Tobacco Use  . Smoking status: Never Smoker  . Smokeless tobacco: Never Used  Vaping Use  . Vaping Use: Never used  Substance and Sexual Activity  . Alcohol use: Yes    Alcohol/week: 15.0 - 20.0 standard drinks    Types: 15 - 20 Cans of beer per week    Comment: 2-3 a day  . Drug use: No  .  Sexual activity: Yes  Other Topics Concern  . Not on file  Social History Narrative  . Not on file   Social Determinants of Health   Financial Resource Strain:   . Difficulty of Paying Living Expenses:   Food Insecurity:   . Worried About Charity fundraiser in the Last Year:   . Arboriculturist in the Last Year:   Transportation Needs:   . Film/video editor (Medical):   Marland Kitchen Lack of Transportation (Non-Medical):   Physical Activity:   . Days of Exercise per Week:   . Minutes of Exercise per Session:   Stress:   . Feeling of Stress :   Social Connections:   . Frequency of Communication with Friends and Family:   . Frequency of Social Gatherings with Friends and Family:   . Attends Religious Services:   . Active Member of Clubs or Organizations:   . Attends Archivist Meetings:   Marland Kitchen Marital Status:   Intimate Partner Violence:   . Fear of Current or Ex-Partner:   . Emotionally Abused:   Marland Kitchen Physically Abused:   . Sexually Abused:     Review of Systems Per HPI. No concerns.   Objective:   Vitals:   02/03/20 1518  BP:  134/72  Pulse: 87  Temp: 97.9 F (36.6 C)  TempSrc: Temporal  SpO2: 97%  Weight: 156 lb (70.8 kg)  Height: 5' 8"  (1.727 m)     Physical Exam Vitals reviewed.  Constitutional:      Appearance: He is well-developed.  HENT:     Head: Normocephalic and atraumatic.     Right Ear: External ear normal.     Left Ear: External ear normal.  Eyes:     Conjunctiva/sclera: Conjunctivae normal.     Pupils: Pupils are equal, round, and reactive to light.  Neck:     Thyroid: No thyromegaly.  Cardiovascular:     Rate and Rhythm: Normal rate and regular rhythm.     Heart sounds: Normal heart sounds.  Pulmonary:     Effort: Pulmonary effort is normal. No respiratory distress.     Breath sounds: Normal breath sounds. No wheezing.  Abdominal:     General: There is no distension.     Palpations: Abdomen is soft.     Tenderness: There is no  abdominal tenderness.     Hernia: A hernia is present.  Musculoskeletal:        General: No tenderness. Normal range of motion.     Cervical back: Normal range of motion and neck supple.  Lymphadenopathy:     Cervical: No cervical adenopathy.  Skin:    General: Skin is warm and dry.  Neurological:     Mental Status: He is alert and oriented to person, place, and time.     Deep Tendon Reflexes: Reflexes are normal and symmetric.  Psychiatric:        Behavior: Behavior normal.        Assessment & Plan:  Dale Webster is a 44 y.o. male . Annual physical exam  - -anticipatory guidance as below in AVS, screening labs above. Health maintenance items as above in HPI discussed/recommended as applicable.   Screening for hyperlipidemia - Plan: Lipid panel  Screening for diabetes mellitus - Plan: Comprehensive metabolic panel  Family history of prostate cancer in father - Plan: PSA Screening for prostate cancer  - We discussed pros and cons of prostate cancer screening, and after this discussion, he chose to have screening done with PSA obtained  No orders of the defined types were placed in this encounter.  Patient Instructions    Keeping you healthy  Get these tests  Blood pressure- Have your blood pressure checked once a year by your healthcare provider.  Normal blood pressure is 120/80.  Weight- Have your body mass index (BMI) calculated to screen for obesity.  BMI is a measure of body fat based on height and weight. You can also calculate your own BMI at [employer]velBags.it.  Cholesterol- Have your cholesterol checked regularly starting at age 80, sooner may be necessary if you have diabetes, high blood pressure, if a family member developed heart diseases at an early age or if you smoke.   Chlamydia, HIV, and other sexual transmitted disease- Get screened each year until the age of 45 then within three months of each new sexual partner.  Diabetes- Have your blood  sugar checked regularly if you have high blood pressure, high cholesterol, a family history of diabetes or if you are overweight.  Get these vaccines  Flu shot- Every fall.  Tetanus shot- Every 10 years.  Menactra- Single dose; prevents meningitis.  Take these steps  Don't smoke- If you do smoke, ask your healthcare provider about quitting. For tips on how  to quit, go to www.smokefree.gov or call 1-800-QUIT-NOW.  Be physically active- Exercise 5 days a week for at least 30 minutes.  If you are not already physically active start slow and gradually work up to 30 minutes of moderate physical activity.  Examples of moderate activity include walking briskly, mowing the yard, dancing, swimming bicycling, etc.  Eat a healthy diet- Eat a variety of healthy foods such as fruits, vegetables, low fat milk, low fat cheese, yogurt, lean meats, poultry, fish, beans, tofu, etc.  For more information on healthy eating, go to www.thenutritionsource.org  Drink alcohol in moderation- Limit alcohol intake two drinks or less a day.  Never drink and drive.  Dentist- Brush and floss teeth twice daily; visit your dentis twice a year.  Depression-Your emotional health is as important as your physical health.  If you're feeling down, losing interest in things you normally enjoy please talk with your healthcare provider.  Gun Safety- If you keep a gun in your home, keep it unloaded and with the safety lock on.  Bullets should be stored separately.  Helmet use- Always wear a helmet when riding a motorcycle, bicycle, rollerblading or skateboarding.  Safe sex- If you may be exposed to a sexually transmitted infection, use a condom  Seat belts- Seat bels can save your life; always wear one.  Smoke/Carbon Monoxide detectors- These detectors need to be installed on the appropriate level of your home.  Replace batteries at least once a year.  Skin Cancer- When out in the sun, cover up and use sunscreen SPF 15 or  higher.  Violence- If anyone is threatening or hurting you, please tell your healthcare provider.    If you have lab work done today you will be contacted with your lab results within the next 2 weeks.  If you have not heard from [place] then please contact us. The fastest way to get your results is to register for My Chart.   IF you received an x-ray today, you will receive an invoice from [facility]. Please contact [facility] at [phone] with questions or concerns regarding your invoice.   IF you received labwork today, you will receive an invoice from [facility]. Please contact [facility] at [phone] with questions or concerns regarding your invoice.   Our billing staff will not be able to assist you with questions regarding bills from these companies.  You will be contacted with the lab results as soon as they are available. The fastest way to get your results is to activate your My Chart account. Instructions are located on the last page of this paperwork. If you have not heard from [place] regarding the results in 2 weeks, please contact this office.         Signed, Merri Ray, MD [facility]

## 2020-02-04 LAB — COMPREHENSIVE METABOLIC PANEL
ALT: 15 IU/L (ref 0–44)
AST: 22 IU/L (ref 0–40)
Albumin/Globulin Ratio: 2.7 — ABNORMAL HIGH (ref 1.2–2.2)
Albumin: 4.8 g/dL (ref 4.0–5.0)
Alkaline Phosphatase: 54 IU/L (ref 48–121)
BUN/Creatinine Ratio: 15 (ref 9–20)
BUN: 16 mg/dL (ref 6–24)
Bilirubin Total: 0.4 mg/dL (ref 0.0–1.2)
CO2: 25 mmol/L (ref 20–29)
Calcium: 9.8 mg/dL (ref 8.7–10.2)
Chloride: 104 mmol/L (ref 96–106)
Creatinine, Ser: 1.05 mg/dL (ref 0.76–1.27)
GFR calc Af Amer: 100 mL/min/{1.73_m2} (ref >59)
GFR calc non Af Amer: 87 mL/min/{1.73_m2} (ref >59)
Globulin, Total: 1.8 g/dL (ref 1.5–4.5)
Glucose: 89 mg/dL (ref 65–99)
Potassium: 3.9 mmol/L (ref 3.5–5.2)
Sodium: 143 mmol/L (ref 134–144)
Total Protein: 6.6 g/dL (ref 6.0–8.5)

## 2020-02-04 LAB — LIPID PANEL
Chol/HDL Ratio: 2.5 ratio (ref 0.0–5.0)
Cholesterol, Total: 174 mg/dL (ref 100–199)
HDL: 69 mg/dL (ref >39)
LDL Chol Calc (NIH): 88 mg/dL (ref 0–99)
Triglycerides: 93 mg/dL (ref 0–149)
VLDL Cholesterol Cal: 17 mg/dL (ref 5–40)

## 2020-02-04 LAB — PSA: Prostate Specific Ag, Serum: 0.5 ng/mL (ref 0.0–4.0)

## 2020-04-11 ENCOUNTER — Ambulatory Visit: Payer: Self-pay | Admitting: Dermatology

## 2020-04-12 DIAGNOSIS — N390 Urinary tract infection, site not specified: Secondary | ICD-10-CM | POA: Insufficient documentation

## 2020-04-18 ENCOUNTER — Other Ambulatory Visit: Payer: Self-pay | Admitting: Registered Nurse

## 2020-04-18 ENCOUNTER — Ambulatory Visit (INDEPENDENT_AMBULATORY_CARE_PROVIDER_SITE_OTHER): Payer: Self-pay | Admitting: Registered Nurse

## 2020-04-18 ENCOUNTER — Encounter: Payer: Self-pay | Admitting: Registered Nurse

## 2020-04-18 ENCOUNTER — Other Ambulatory Visit: Payer: Self-pay

## 2020-04-18 VITALS — BP 137/83 | HR 98 | Temp 97.8°F | Resp 18 | Ht 68.0 in | Wt 159.4 lb

## 2020-04-18 DIAGNOSIS — R509 Fever, unspecified: Secondary | ICD-10-CM

## 2020-04-18 DIAGNOSIS — R350 Frequency of micturition: Secondary | ICD-10-CM

## 2020-04-18 LAB — POCT URINALYSIS DIP (CLINITEK)
Bilirubin, UA: NEGATIVE
Glucose, UA: NEGATIVE mg/dL
Ketones, POC UA: NEGATIVE mg/dL
Nitrite, UA: NEGATIVE
POC PROTEIN,UA: NEGATIVE
Spec Grav, UA: 1.01 (ref 1.010–1.025)
Urobilinogen, UA: 0.2 E.U./dL
pH, UA: 5 (ref 5.0–8.0)

## 2020-04-18 MED ORDER — SULFAMETHOXAZOLE-TRIMETHOPRIM 800-160 MG PO TABS: 1.0000 | ORAL_TABLET | Freq: Two times a day (BID) | ORAL | 0 refills | Status: DC

## 2020-04-18 NOTE — Patient Instructions (Signed)
° ° ° °  If you have lab work done today you will be contacted with your lab results within the next 2 weeks.  If you have not heard from us then please contact us. The fastest way to get your results is to register for My Chart. ° ° °IF you received an x-ray today, you will receive an invoice from [facility]. Please contact [facility] at [phone] with questions or concerns regarding your invoice.  ° °IF you received labwork today, you will receive an invoice from [facility]. Please contact [facility] at [phone] with questions or concerns regarding your invoice.  ° °Our billing staff will not be able to assist you with questions regarding bills from these companies. ° °You will be contacted with the lab results as soon as they are available. The fastest way to get your results is to activate your My Chart account. Instructions are located on the last page of this paperwork. If you have not heard from us regarding the results in 2 weeks, please contact this office. °  ° ° ° °

## 2020-04-18 NOTE — Progress Notes (Signed)
Acute Office Visit  Subjective:    Patient ID: Dale Webster, male    DOB: Aug 17, 1975, 44 y.o.   MRN: [id number]  Chief Complaint  Patient presents with  . Urinary Tract Infection    patient states he feels like he has an UTI patient states at first he was able to urinate then he started drinking more water and OTC medications but now he dont feel like he is urinating enough this all started last thursday,    HPI Patient is in today for dysuria  Onset last week. Tried azo and d-mannose. Some relief has been pushing fluids, but feels like he's not urinating enough. Pain is in penis. No testicular, suprapubic, or lower back pain Does report feeling feverish and some chills.  No new sexual partners, no risk for sti No discharge or rash  Has not happened before.  Otherwise no concerns or complaints. COVID Vaccine second dose given in April 2021  No past medical history on file.  Past Surgical History:  Procedure Laterality Date  . mole biopsy     benign    Family History  Problem Relation Age of Onset  . Melanoma Sister   . Cancer Father     Social History   Socioeconomic History  . Marital status: Married    Spouse name: Not on file  . Number of children: Not on file  . Years of education: Not on file  . Highest education level: Not on file  Occupational History  . Occupation: Leisure centre manager  Tobacco Use  . Smoking status: Never Smoker  . Smokeless tobacco: Never Used  Vaping Use  . Vaping Use: Never used  Substance and Sexual Activity  . Alcohol use: Yes    Alcohol/week: 15.0 - 20.0 standard drinks    Types: 15 - 20 Cans of beer per week    Comment: 2-3 a day  . Drug use: No  . Sexual activity: Yes  Other Topics Concern  . Not on file  Social History Narrative  . Not on file   Social Determinants of Health   Financial Resource Strain:   . Difficulty of Paying Living Expenses: Not on file  Food Insecurity:   . Worried About Charity fundraiser in  the Last Year: Not on file  . Ran Out of Food in the Last Year: Not on file  Transportation Needs:   . Lack of Transportation (Medical): Not on file  . Lack of Transportation (Non-Medical): Not on file  Physical Activity:   . Days of Exercise per Week: Not on file  . Minutes of Exercise per Session: Not on file  Stress:   . Feeling of Stress : Not on file  Social Connections:   . Frequency of Communication with Friends and Family: Not on file  . Frequency of Social Gatherings with Friends and Family: Not on file  . Attends Religious Services: Not on file  . Active Member of Clubs or Organizations: Not on file  . Attends Archivist Meetings: Not on file  . Marital Status: Not on file  Intimate Partner Violence:   . Fear of Current or Ex-Partner: Not on file  . Emotionally Abused: Not on file  . Physically Abused: Not on file  . Sexually Abused: Not on file    No outpatient medications prior to visit.   No facility-administered medications prior to visit.    No Known Allergies  Review of Systems  Constitutional: Negative.   HENT: Negative.  Eyes: Negative.   Respiratory: Negative.   Cardiovascular: Negative.   Gastrointestinal: Negative.   Endocrine: Negative.   Genitourinary: Positive for decreased urine volume, dysuria and penile pain. Negative for difficulty urinating, discharge, enuresis, flank pain, frequency, genital sores, hematuria, penile swelling, scrotal swelling, testicular pain and urgency.  Musculoskeletal: Negative.   Skin: Negative.   Allergic/Immunologic: Negative.   Neurological: Negative.   Hematological: Negative.   Psychiatric/Behavioral: Negative.   All other systems reviewed and are negative.      Objective:    Physical Exam Vitals and nursing note reviewed.  Constitutional:      General: He is not in acute distress.    Appearance: Normal appearance. He is not ill-appearing or diaphoretic.  Cardiovascular:     Rate and Rhythm:  Normal rate and regular rhythm.  Pulmonary:     Effort: Pulmonary effort is normal. No respiratory distress.  Skin:    General: Skin is warm and dry.  Neurological:     General: No focal deficit present.     Mental Status: He is alert and oriented to person, place, and time. Mental status is at baseline.  Psychiatric:        Mood and Affect: Mood normal.        Behavior: Behavior normal.        Thought Content: Thought content normal.        Judgment: Judgment normal.     BP 137/83   Pulse 98   Temp 97.8 F (36.6 C) (Temporal)   Resp 18   Ht 5' 8"  (1.727 m)   Wt 159 lb 6.4 oz (72.3 kg)   SpO2 97%   BMI 24.24 kg/m  Wt Readings from Last 3 Encounters:  04/18/20 159 lb 6.4 oz (72.3 kg)  02/03/20 156 lb (70.8 kg)  07/26/16 157 lb (71.2 kg)    There are no preventive care reminders to display for this patient.  There are no preventive care reminders to display for this patient.   No results found for: TSH Lab Results  Component Value Date   WBC 5.5 11/22/2011   HGB 13.7 11/22/2011   HCT 41.6 11/22/2011   MCV 86.5 11/22/2011   PLT 200 11/22/2011   Lab Results  Component Value Date   NA 143 02/03/2020   K 3.9 02/03/2020   CO2 25 02/03/2020   GLUCOSE 89 02/03/2020   BUN 16 02/03/2020   CREATININE 1.05 02/03/2020   BILITOT 0.4 02/03/2020   ALKPHOS 54 02/03/2020   AST 22 02/03/2020   ALT 15 02/03/2020   PROT 6.6 02/03/2020   ALBUMIN 4.8 02/03/2020   CALCIUM 9.8 02/03/2020   Lab Results  Component Value Date   CHOL 174 02/03/2020   Lab Results  Component Value Date   HDL 69 02/03/2020   Lab Results  Component Value Date   LDLCALC 88 02/03/2020   Lab Results  Component Value Date   TRIG 93 02/03/2020   Lab Results  Component Value Date   CHOLHDL 2.5 02/03/2020   No results found for: HGBA1C     Assessment & Plan:   Problem List Items Addressed This Visit    None    Visit Diagnoses    Frequent urination    -  Primary   Relevant Orders    POCT URINALYSIS DIP (CLINITEK) (Completed)   SAR CoV2 Serology (COVID 19)AB(IGG)IA   Urine Culture   PSA   Fever, unspecified       Relevant Orders  SAR CoV2 Serology (COVID 19)AB(IGG)IA       No orders of the defined types were placed in this encounter.  PLAN  Culture shows blood and wbcs. Concern for UTI.   Low risk for renal calculi or prostatitis given nature of pain, but will send out PSA given fam hx of prostate ca.  Will check covid ab given systemic symptoms to ensure his immunity.   Send urine for culture  Bactrim po bid for 7 days  Patient encouraged to call clinic with any questions, comments, or concerns.  Maximiano Coss, NP

## 2020-04-19 ENCOUNTER — Encounter: Payer: Self-pay | Admitting: Registered Nurse

## 2020-04-19 LAB — SAR COV2 SEROLOGY (COVID19)AB(IGG),IA: DiaSorin SARS-CoV-2 Ab, IgG: POSITIVE

## 2020-04-19 LAB — PSA: Prostate Specific Ag, Serum: 3.1 ng/mL (ref 0.0–4.0)

## 2020-04-20 LAB — URINE CULTURE

## 2020-04-24 ENCOUNTER — Other Ambulatory Visit: Payer: Self-pay | Admitting: Registered Nurse

## 2020-04-24 ENCOUNTER — Encounter: Payer: Self-pay | Admitting: Registered Nurse

## 2020-04-24 ENCOUNTER — Encounter: Payer: Self-pay | Admitting: Family Medicine

## 2020-04-24 DIAGNOSIS — R350 Frequency of micturition: Secondary | ICD-10-CM

## 2020-04-24 MED ORDER — CEPHALEXIN 500 MG PO CAPS: 500.0000 mg | ORAL_CAPSULE | Freq: Four times a day (QID) | ORAL | 0 refills | Status: DC

## 2020-04-25 ENCOUNTER — Other Ambulatory Visit: Payer: Self-pay

## 2020-05-30 ENCOUNTER — Ambulatory Visit (INDEPENDENT_AMBULATORY_CARE_PROVIDER_SITE_OTHER): Payer: Commercial Managed Care - PPO | Admitting: Registered Nurse

## 2020-05-30 ENCOUNTER — Other Ambulatory Visit: Payer: Self-pay

## 2020-05-30 ENCOUNTER — Encounter: Payer: Self-pay | Admitting: Registered Nurse

## 2020-05-30 ENCOUNTER — Other Ambulatory Visit (HOSPITAL_COMMUNITY)
Admission: RE | Admit: 2020-05-30 | Discharge: 2020-05-30 | Disposition: A | Discharge status: Home / Self Care | Payer: Commercial Managed Care - PPO | Source: Ambulatory Visit | Attending: Registered Nurse | Admitting: Registered Nurse

## 2020-05-30 VITALS — BP 124/80 | HR 78 | Temp 98.1°F | Ht 68.0 in | Wt 163.8 lb

## 2020-05-30 DIAGNOSIS — N410 Acute prostatitis: Secondary | ICD-10-CM

## 2020-05-30 DIAGNOSIS — R350 Frequency of micturition: Secondary | ICD-10-CM | POA: Insufficient documentation

## 2020-05-30 LAB — POCT URINALYSIS DIP (MANUAL ENTRY)
Bilirubin, UA: NEGATIVE
Glucose, UA: NEGATIVE mg/dL
Ketones, POC UA: NEGATIVE mg/dL
Nitrite, UA: NEGATIVE
Protein Ur, POC: NEGATIVE mg/dL
Spec Grav, UA: 1.01 (ref 1.010–1.025)
Urobilinogen, UA: 0.2 E.U./dL
pH, UA: 6.5 (ref 5.0–8.0)

## 2020-05-30 LAB — CBC
Hemoglobin: 14 g/dL (ref 13.0–17.7)
RBC: 4.7 x10E6/uL (ref 4.14–5.80)
WBC: 6.1 10*3/uL (ref 3.4–10.8)

## 2020-05-30 LAB — PSA

## 2020-05-30 MED ORDER — CIPROFLOXACIN HCL 500 MG PO TABS: 500.0000 mg | ORAL_TABLET | Freq: Two times a day (BID) | ORAL | 0 refills | Status: DC

## 2020-05-30 NOTE — Patient Instructions (Signed)
° ° ° °  If you have lab work done today you will be contacted with your lab results within the next 2 weeks.  If you have not heard from us then please contact us. The fastest way to get your results is to register for My Chart. ° ° °IF you received an x-ray today, you will receive an invoice from [facility]. Please contact [facility] at [phone] with questions or concerns regarding your invoice.  ° °IF you received labwork today, you will receive an invoice from [facility]. Please contact [facility] at [phone] with questions or concerns regarding your invoice.  ° °Our billing staff will not be able to assist you with questions regarding bills from these companies. ° °You will be contacted with the lab results as soon as they are available. The fastest way to get your results is to activate your My Chart account. Instructions are located on the last page of this paperwork. If you have not heard from us regarding the results in 2 weeks, please contact this office. °  ° ° ° °

## 2020-05-30 NOTE — Progress Notes (Signed)
Acute Office Visit  Subjective:    Patient ID: Dale Webster, male    DOB: Dec 19, 1975, 44 y.o.   MRN: [id number]  Chief Complaint  Patient presents with  . Follow-up    uti sx, still having freguency and painful urination    HPI Patient is in today for recurrent pain with urination Now having some nocturia Now having some pain with ejaculation No blood in urine or semen  Completed courses of keflex and bactrim with pain relieving each time after  No hx of prostatitis No new sexual partners, no discharge, no testicular pain, no rectal pain Does have fam hx of prostate cancer Had one instance of  No past medical history on file.  Past Surgical History:  Procedure Laterality Date  . mole biopsy     benign    Family History  Problem Relation Age of Onset  . Melanoma Sister   . Cancer Father     Social History   Socioeconomic History  . Marital status: Married    Spouse name: Not on file  . Number of children: Not on file  . Years of education: Not on file  . Highest education level: Not on file  Occupational History  . Occupation: Leisure centre manager  Tobacco Use  . Smoking status: Never Smoker  . Smokeless tobacco: Never Used  Vaping Use  . Vaping Use: Never used  Substance and Sexual Activity  . Alcohol use: Yes    Alcohol/week: 15.0 - 20.0 standard drinks    Types: 15 - 20 Cans of beer per week    Comment: 2-3 a day  . Drug use: No  . Sexual activity: Yes  Other Topics Concern  . Not on file  Social History Narrative  . Not on file   Social Determinants of Health   Financial Resource Strain:   . Difficulty of Paying Living Expenses: Not on file  Food Insecurity:   . Worried About Charity fundraiser in the Last Year: Not on file  . Ran Out of Food in the Last Year: Not on file  Transportation Needs:   . Lack of Transportation (Medical): Not on file  . Lack of Transportation (Non-Medical): Not on file  Physical Activity:   . Days of Exercise per  Week: Not on file  . Minutes of Exercise per Session: Not on file  Stress:   . Feeling of Stress : Not on file  Social Connections:   . Frequency of Communication with Friends and Family: Not on file  . Frequency of Social Gatherings with Friends and Family: Not on file  . Attends Religious Services: Not on file  . Active Member of Clubs or Organizations: Not on file  . Attends Archivist Meetings: Not on file  . Marital Status: Not on file  Intimate Partner Violence:   . Fear of Current or Ex-Partner: Not on file  . Emotionally Abused: Not on file  . Physically Abused: Not on file  . Sexually Abused: Not on file    Outpatient Medications Prior to Visit  Medication Sig Dispense Refill  . sulfamethoxazole-trimethoprim (BACTRIM DS) 800-160 MG tablet Take 1 tablet by mouth 2 (two) times daily. 14 tablet 0  . cephALEXin (KEFLEX) 500 MG capsule Take 1 capsule (500 mg total) by mouth 4 (four) times daily. 20 capsule 0   No facility-administered medications prior to visit.    No Known Allergies  Review of Systems  Constitutional: Negative.   HENT: Negative.  Eyes: Negative.   Respiratory: Negative.   Cardiovascular: Negative.   Gastrointestinal: Negative.   Genitourinary: Positive for difficulty urinating, dysuria and urgency. Negative for discharge, penile pain and penile swelling.  Musculoskeletal: Negative.   Skin: Negative.   Neurological: Negative.   Psychiatric/Behavioral: Negative.        Objective:    Physical Exam Vitals and nursing note reviewed.  Constitutional:      Appearance: Normal appearance. He is normal weight.  Cardiovascular:     Rate and Rhythm: Normal rate and regular rhythm.  Pulmonary:     Effort: Pulmonary effort is normal. No respiratory distress.  Genitourinary:    Comments: Declined by patient Neurological:     General: No focal deficit present.     Mental Status: He is alert and oriented to person, place, and time. Mental  status is at baseline.  Psychiatric:        Mood and Affect: Mood normal.        Behavior: Behavior normal.        Thought Content: Thought content normal.        Judgment: Judgment normal.     BP 124/80   Pulse 78   Temp 98.1 F (36.7 C)   Ht 5' 8"  (1.727 m)   Wt 163 lb 12.8 oz (74.3 kg)   SpO2 98%   BMI 24.91 kg/m  Wt Readings from Last 3 Encounters:  05/30/20 163 lb 12.8 oz (74.3 kg)  04/18/20 159 lb 6.4 oz (72.3 kg)  02/03/20 156 lb (70.8 kg)    There are no preventive care reminders to display for this patient.  There are no preventive care reminders to display for this patient.   No results found for: TSH Lab Results  Component Value Date   WBC 5.5 11/22/2011   HGB 13.7 11/22/2011   HCT 41.6 11/22/2011   MCV 86.5 11/22/2011   PLT 200 11/22/2011   Lab Results  Component Value Date   NA 143 02/03/2020   K 3.9 02/03/2020   CO2 25 02/03/2020   GLUCOSE 89 02/03/2020   BUN 16 02/03/2020   CREATININE 1.05 02/03/2020   BILITOT 0.4 02/03/2020   ALKPHOS 54 02/03/2020   AST 22 02/03/2020   ALT 15 02/03/2020   PROT 6.6 02/03/2020   ALBUMIN 4.8 02/03/2020   CALCIUM 9.8 02/03/2020   Lab Results  Component Value Date   CHOL 174 02/03/2020   Lab Results  Component Value Date   HDL 69 02/03/2020   Lab Results  Component Value Date   LDLCALC 88 02/03/2020   Lab Results  Component Value Date   TRIG 93 02/03/2020   Lab Results  Component Value Date   CHOLHDL 2.5 02/03/2020   No results found for: HGBA1C     Assessment & Plan:   Problem List Items Addressed This Visit    None    Visit Diagnoses    Frequent urination    -  Primary   Relevant Orders   POCT urinalysis dipstick (Completed)   Urine Culture   Urine cytology ancillary only   Acute prostatitis       Relevant Orders   PSA   CBC       Meds ordered this encounter  Medications  . DISCONTD: ciprofloxacin (CIPRO) 500 MG tablet    Sig: Take 1 tablet (500 mg total) by mouth 2 (two)  times daily.    Dispense:  56 tablet    Refill:  0  Order Specific Question:   Supervising Provider    Answer:   Carlota Raspberry, JEFFREY R [[id number]]   PLAN  Cipro 521m PO bid for 4 weeks  Will draw PSA and CBC. Sending culture, as dipstick shows blood and leuks, will send cytollogy  Patient encouraged to call clinic with any questions, comments, or concerns.  RMaximiano Coss NP

## 2020-05-31 LAB — CBC
Hematocrit: 41.4 % (ref 37.5–51.0)
MCH: 29.8 pg (ref 26.6–33.0)
MCHC: 33.8 g/dL (ref 31.5–35.7)
MCV: 88 fL (ref 79–97)
Platelets: 237 10*3/uL (ref 150–450)
RDW: 12.1 % (ref 11.6–15.4)

## 2020-05-31 LAB — URINE CYTOLOGY ANCILLARY ONLY
Chlamydia: NEGATIVE
Comment: NEGATIVE
Comment: NEGATIVE
Comment: NORMAL
Neisseria Gonorrhea: NEGATIVE
Trichomonas: NEGATIVE

## 2020-06-02 LAB — URINE CULTURE

## 2020-09-13 ENCOUNTER — Encounter: Payer: Self-pay | Admitting: Dermatology

## 2020-09-13 ENCOUNTER — Other Ambulatory Visit: Payer: Self-pay

## 2020-09-13 ENCOUNTER — Ambulatory Visit (INDEPENDENT_AMBULATORY_CARE_PROVIDER_SITE_OTHER): Payer: Commercial Managed Care - PPO | Admitting: Dermatology

## 2020-09-13 DIAGNOSIS — Z1283 Encounter for screening for malignant neoplasm of skin: Secondary | ICD-10-CM

## 2020-09-13 DIAGNOSIS — L821 Other seborrheic keratosis: Secondary | ICD-10-CM

## 2020-09-13 DIAGNOSIS — D1800 Hemangioma unspecified site: Secondary | ICD-10-CM

## 2020-09-13 DIAGNOSIS — B360 Pityriasis versicolor: Secondary | ICD-10-CM

## 2020-09-13 DIAGNOSIS — L918 Other hypertrophic disorders of the skin: Secondary | ICD-10-CM

## 2020-09-23 ENCOUNTER — Encounter: Payer: Self-pay | Admitting: Dermatology

## 2020-09-23 NOTE — Progress Notes (Signed)
   Follow-Up Visit   Subjective  Dale Webster is a 45 y.o. male who presents for the following: Annual Exam (No new cocnerns).  General skin examination Location:  Duration:  Quality:  Associated Signs/Symptoms: Modifying Factors:  Severity:  Timing: Context:   Objective  Well appearing patient in no apparent distress; mood and affect are within normal limits. Objective  Right Abdomen (side) - Upper: Full body skin examination- no atypical moles or non mole skin cancer  Objective  Neck - Anterior: Fleshy, skin-colored pedunculated papules.    Objective  Left Upper Back: Branny salmonpink color upper torso  Objective  Right Lower Back: Stuck-on, waxy, tan-brown papules and plaques. --Discussed benign etiology and prognosis.   Objective  Right Upper Back: Red raised 2 mm papule    A full examination was performed including scalp, head, eyes, ears, nose, lips, neck, chest, axillae, abdomen, back, buttocks, bilateral upper extremities, bilateral lower extremities, hands, feet, fingers, toes, fingernails, and toenails. All findings within normal limits unless otherwise noted below.   Assessment & Plan    Encounter for screening for malignant neoplasm of skin Right Abdomen (side) - Upper  Yearly skin check  Skin tag Neck - Anterior  Okay to leave  Tinea versicolor Left Upper Back  Currently not bothersome to patient; he can call me in the warm weather if this spreads or itches or he wants treatment.  Seborrheic keratosis Right Lower Back  Okay to leave if stable  Hemangioma, unspecified site Right Upper Back  Okay to leave if stable   Skin cancer screening performed today.    I, Lavonna Monarch, MD, have reviewed all documentation for this visit.  The documentation on 09/23/20 for the exam, diagnosis, procedures, and orders are all accurate and complete.

## 2021-10-17 ENCOUNTER — Ambulatory Visit (INDEPENDENT_AMBULATORY_CARE_PROVIDER_SITE_OTHER): Payer: Commercial Managed Care - PPO | Admitting: Family Medicine

## 2021-10-17 ENCOUNTER — Encounter: Payer: Self-pay | Admitting: Family Medicine

## 2021-10-17 VITALS — BP 116/68 | HR 65 | Temp 97.7°F | Resp 16 | Ht 68.0 in | Wt 160.2 lb

## 2021-10-17 DIAGNOSIS — Z0001 Encounter for general adult medical examination with abnormal findings: Secondary | ICD-10-CM

## 2021-10-17 DIAGNOSIS — Z13220 Encounter for screening for lipoid disorders: Secondary | ICD-10-CM | POA: Diagnosis not present

## 2021-10-17 DIAGNOSIS — Z125 Encounter for screening for malignant neoplasm of prostate: Secondary | ICD-10-CM | POA: Diagnosis not present

## 2021-10-17 DIAGNOSIS — Z8042 Family history of malignant neoplasm of prostate: Secondary | ICD-10-CM

## 2021-10-17 DIAGNOSIS — Z131 Encounter for screening for diabetes mellitus: Secondary | ICD-10-CM

## 2021-10-17 DIAGNOSIS — Z1329 Encounter for screening for other suspected endocrine disorder: Secondary | ICD-10-CM | POA: Diagnosis not present

## 2021-10-17 DIAGNOSIS — Z130 Encounter for screening for diseases of the blood and blood-forming organs and certain disorders involving the immune mechanism: Secondary | ICD-10-CM | POA: Diagnosis not present

## 2021-10-17 DIAGNOSIS — B360 Pityriasis versicolor: Secondary | ICD-10-CM

## 2021-10-17 DIAGNOSIS — Z1211 Encounter for screening for malignant neoplasm of colon: Secondary | ICD-10-CM

## 2021-10-17 DIAGNOSIS — F439 Reaction to severe stress, unspecified: Secondary | ICD-10-CM

## 2021-10-17 DIAGNOSIS — Z0000 Encounter for general adult medical examination without abnormal findings: Secondary | ICD-10-CM

## 2021-10-17 LAB — LIPID PANEL
Cholesterol: 174 mg/dL (ref 0–200)
HDL: 60 mg/dL (ref >39.00)
LDL Cholesterol: 101 mg/dL — ABNORMAL HIGH (ref 0–99)
NonHDL: 114.23
Total CHOL/HDL Ratio: 3
Triglycerides: 66 mg/dL (ref 0.0–149.0)
VLDL: 13.2 mg/dL (ref 0.0–40.0)

## 2021-10-17 LAB — CBC
HCT: 42.6 % (ref 39.0–52.0)
Hemoglobin: 14.5 g/dL (ref 13.0–17.0)
MCHC: 34 g/dL (ref 30.0–36.0)
MCV: 86.3 fl (ref 78.0–100.0)
Platelets: 192 10*3/uL (ref 150.0–400.0)
RBC: 4.93 Mil/uL (ref 4.22–5.81)
RDW: 13.3 % (ref 11.5–15.5)
WBC: 4.8 10*3/uL (ref 4.0–10.5)

## 2021-10-17 LAB — COMPREHENSIVE METABOLIC PANEL
ALT: 17 U/L (ref 0–53)
AST: 22 U/L (ref 0–37)
Albumin: 5 g/dL (ref 3.5–5.2)
Alkaline Phosphatase: 51 U/L (ref 39–117)
BUN: 17 mg/dL (ref 6–23)
CO2: 29 mEq/L (ref 19–32)
Calcium: 10.1 mg/dL (ref 8.4–10.5)
Chloride: 104 mEq/L (ref 96–112)
Creatinine, Ser: 1.05 mg/dL (ref 0.40–1.50)
GFR: 85.99 mL/min (ref >60.00)
Glucose, Bld: 90 mg/dL (ref 70–99)
Potassium: 4.1 mEq/L (ref 3.5–5.1)
Sodium: 142 mEq/L (ref 135–145)
Total Bilirubin: 0.7 mg/dL (ref 0.2–1.2)
Total Protein: 6.5 g/dL (ref 6.0–8.3)

## 2021-10-17 LAB — TSH: TSH: 2.68 u[IU]/mL (ref 0.35–5.50)

## 2021-10-17 LAB — PSA: PSA: 0.28 ng/mL (ref 0.10–4.00)

## 2021-10-17 MED ORDER — KETOCONAZOLE 200 MG PO TABS: 400.0000 mg | ORAL_TABLET | Freq: Once | ORAL | 0 refills | Status: AC

## 2021-10-17 NOTE — Progress Notes (Signed)
Subjective:  Patient ID: Dale Webster, male    DOB: 1976/02/18  Age: 46 y.o. MRN: [id number]  CC:  Chief Complaint  Patient presents with   Annual Exam    Pt here for physical, does note father had prostate cancer and was unsure if he should have PSA checked at this age     HPI Dale Webster presents for  Annual exam.  New puppy in family past few weeks. Son turning 23 this July - at [facility].   No chronic medications.  Treated for acute prostatitis in October 2021, no recurrence.   Depression screen [facility] 2/9 10/17/2021 04/18/2020 02/03/2020 07/26/2016  Decreased Interest 1 0 0 0  Down, Depressed, Hopeless 1 0 0 0  PHQ - 2 Score 2 0 0 0  Altered sleeping 0 - - -  Tired, decreased energy 1 - - -  Change in appetite 0 - - -  Feeling bad or failure about yourself  1 - - -  Trouble concentrating 1 - - -  Moving slowly or fidgety/restless 0 - - -  Suicidal thoughts 0 - - -  PHQ-9 Score 5 - - -  Stressed with work pressure, less sleep with new puppy. Denies depression. Work will calm down in next month. No hx of depression or meds no SI.  Hobby - music. Plays saxophone, prior archery. Needs vacation - possibly as slowdown in work.   Tinea versicolor: Experienced in the past.  Currently experiencing flare on chest, back, upper shoulders.  Has treated with Selsun Blue in the past over-the-counter with minimal relief.  Cancer screening Colon:  Screening options with colonoscopy versus Cologuard discussed. Discussed timing of repeat testing intervals if normal, as well as potential need for diagnostic Colonoscopy if positive Cologuard. Understanding expressed, and chose Cologuard.   Family history of prostate cancer in his father at age 83 Lab Results  Component Value Date   PSA1 2.6 05/30/2020   PSA1 3.1 04/18/2020   PSA1 0.5 02/03/2020  The natural history of prostate cancer and ongoing controversy regarding screening and potential treatment outcomes of prostate cancer has been  discussed with the patient. The meaning of a false positive PSA and a false negative PSA has been discussed. He indicates understanding of the limitations of this screening test and wishes to proceed with screening PSA testing.  Skin: maternal grandfather with skin cancer, sister with malignant melanoma. Has been followed by dermatology, Dr. Denna Haggard - will schedule appt.   Immunization History  Administered Date(s) Administered   Tdap 09/01/2011  Flu vaccine: declines.  COVID-vaccine: Pfizer vaccine in March 2021.  Booster - did receive, not bivalent. recommended  Optho: wears glasses.  Appt pending in 2 weeks.   Dental: every 6 months. Appt 2 weeks ago.   Alcohol: 12-15 bourbon per week - some plans to cut back.   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 Still busy, active. 2 walks per day with dogs.   Declines STI screening.    History Patient Active Problem List   Diagnosis Date Noted   Scalp wound 09/11/2011   History reviewed. No pertinent past medical history. Past Surgical History:  Procedure Laterality Date   mole biopsy     benign   No Known Allergies Prior to Admission medications   Not on File   Social History   Socioeconomic History   Marital status: Married    Spouse name: Not on file  Number of children: Not on file   Years of education: Not on file   Highest education level: Not on file  Occupational History   Occupation: energy analyst  Tobacco Use   Smoking status: Never   Smokeless tobacco: Never  Vaping Use   Vaping Use: Never used  Substance and Sexual Activity   Alcohol use: Yes    Alcohol/week: 15.0 - 20.0 standard drinks    Types: 15 - 20 Cans of beer per week    Comment: 2-3 a day   Drug use: No   Sexual activity: Yes  Other Topics Concern   Not on file  Social History Narrative   Not on file   Social Determinants of Health   Financial Resource Strain: Not  on file  Food Insecurity: Not on file  Transportation Needs: Not on file  Physical Activity: Not on file  Stress: Not on file  Social Connections: Not on file  Intimate Partner Violence: Not on file    Review of Systems 13 point review of systems per patient health survey noted.  Negative other than as indicated above or in HPI.    Objective:   Vitals:   10/17/21 0801  BP: 116/68  Pulse: 65  Resp: 16  Temp: 97.7 F (36.5 C)  TempSrc: Temporal  SpO2: 100%  Weight: 160 lb 3.2 oz (72.7 kg)  Height: _0  (1.727 m)     Physical Exam Vitals reviewed.  Constitutional:      Appearance: He is well-developed.  HENT:     Head: Normocephalic and atraumatic.     Right Ear: External ear normal.     Left Ear: External ear normal.  Eyes:     Conjunctiva/sclera: Conjunctivae normal.     Pupils: Pupils are equal, round, and reactive to light.  Neck:     Thyroid: No thyromegaly.  Cardiovascular:     Rate and Rhythm: Normal rate and regular rhythm.     Heart sounds: Normal heart sounds.  Pulmonary:     Effort: Pulmonary effort is normal. No respiratory distress.     Breath sounds: Normal breath sounds. No wheezing.  Abdominal:     General: There is no distension.     Palpations: Abdomen is soft.     Tenderness: There is no abdominal tenderness.  Musculoskeletal:        General: No tenderness. Normal range of motion.     Cervical back: Normal range of motion and neck supple.  Lymphadenopathy:     Cervical: No cervical adenopathy.  Skin:    General: Skin is warm and dry.     Comments: Diffuse salmon-colored/hyperpigmented patches across the upper chest, abdomen, mid back and shoulders.  Neurological:     Mental Status: He is alert and oriented to person, place, and time.     Deep Tendon Reflexes: Reflexes are normal and symmetric.  Psychiatric:        Mood and Affect: Mood normal.        Behavior: Behavior normal.    Assessment & Plan:  Dale Webster is a 46 y.o. male  . Annual physical exam  - -anticipatory guidance as below in AVS, screening labs above. Health maintenance items as above in HPI discussed/recommended as applicable.   Screening for diabetes mellitus - Plan: Comprehensive metabolic panel  Screening for thyroid disorder - Plan: TSH  Screening, anemia, deficiency, iron - Plan: CBC  Situational stress  -Denies depression, likely reaction to stress/adjustment.  Plans to try for vacation  when work slows down.  Coping techniques discussed, handout given on stress management with RTC precautions given.  Continue exercise.  Plans to cut back on alcohol.  Screening for prostate cancer - Plan: PSA Family history of prostate cancer in father - Plan: PSA  Screen for colon cancer - Plan: Cologuard  Screening for hyperlipidemia - Plan: Lipid panel  Tinea versicolor - Plan: ketoconazole (NIZORAL) 200 MG tablet  -Diffuse involvement.  Initial treatment with ketoconazole but may need prolonged or repeat treatment given amount of involvement.  Also plan for dermatology follow-up given family history of skin cancers.  Meds ordered this encounter  Medications   ketoconazole (NIZORAL) 200 MG tablet    Sig: Take 2 tablets (400 mg total) by mouth once for 1 dose.    Dispense:  2 tablet    Refill:  0   Patient Instructions  See info on stress, but follow up if you are feeling continued depression symptoms.  Cutting back on alcohol may also help. I will check labs as we discussed and let you know if any concerns.  Take 2 ketoconazole at once for tinea versicolor as we discussed.  If symptoms persist we can extend that course if needed.  Keep me posted and thanks for coming in today.   Tinea Versicolor Tinea versicolor is a common fungal infection. It causes a rash that looks like light or dark patches on the skin. The rash most often occurs on the chest, back, neck, or upper arms. This condition is more common during warm weather. Tinea versicolor  usually does not cause any other problems than the rash. In most cases, the infection goes away in a few weeks with treatment. It may take a few months for the patches on your skin to return to your usual skin color. What are the causes? This condition occurs when a certain type of fungus (Malassezia furfur) that is normally present on the skin starts to grow too much. This fungus is a type of yeast. This condition cannot be passed from one person to another (is not contagious). What increases the risk? This condition is more likely to develop when certain factors are present, such as: Heat and humidity. Sweating too much. Hormone changes, such as those that occur when taking birth control pills. Oily skin. A weak disease-fighting system (immunesystem). What are the signs or symptoms? Symptoms of this condition include: A rash of light or dark patches on your skin. The rash may have: Patches of tan or pink spots (on light skin). Patches of white or brown spots (on dark skin). Patches of skin that do not tan. Well-marked edges. Scales on the discolored areas. Mild itching. There may also be no itching. How is this diagnosed? A health care provider can usually diagnose this condition by looking at your skin. During the exam, he or she may use ultraviolet (UV) light to see how much of your skin has been affected. In some cases, a skin sample may be taken by scraping the rash. This sample will be viewed under a microscope to check for yeast overgrowth. How is this treated? Treatment for this condition may include: Dandruff shampoo that is applied to the affected skin during showers or bathing. Over-the-counter medicated skin cream, lotion, or soaps. Prescription antifungal medicine in the form of skin cream or pills. Medicine to help reduce itching. Follow these instructions at home: Use over-the-counter and prescription medicines only as told by your health care provider. Apply dandruff  shampoo to the  affected area as told by your health care provider. Do not scratch the affected area of skin. Avoid hot and humid conditions. Do not use tanning booths. Try to avoid sweating a lot. Contact a health care provider if: Your symptoms get worse. You have a fever. You have signs of infection such as: Redness, swelling, or pain at the site of your rash. Warmth coming from your rash. Fluid or blood coming from your rash. Pus or a bad smell coming from your rash. Your rash comes back (recurs) after treatment. Your rash does not improve with treatment and spreads to other parts of the body. Summary Tinea versicolor is a common fungal infection of the skin. It causes a rash that looks like light or dark patches on the skin. The rash most often occurs on the chest, back, neck, or upper arms. A health care provider can usually diagnose this condition by looking at your skin. Treatment may include applying shampoo to the skin and taking or applying medicines. This information is not intended to replace advice given to you by your health care provider. Make sure you discuss any questions you have with your health care provider. Document Revised: 10/17/2020 Document Reviewed: 10/17/2020 Elsevier Patient Education  2022 [place], Adult Stress is a normal reaction to life events. Stress is what you feel when life demands more than you are used to, or more than you think you can handle. Some stress can be useful, such as studying for a test or meeting a deadline at work. Stress that occurs too often or for too long can cause problems. Long-lasting stress is called chronic stress. Chronic stress can affect your emotional health and interfere with relationships and normal daily activities. Too much stress can weaken your body's defense system (immune system) and increase your risk for physical illness. If you already have a medical problem, stress can make it worse. What are the  causes? All sorts of life events can cause stress. An event that causes stress for one person may not be stressful for someone else. Major life events, whether positive or negative, commonly cause stress. Examples include: Losing a job or starting a new job. Losing a loved one. Moving to a new town or home. Getting married or divorced. Having a baby. Getting injured or sick. Less obvious life events can also cause stress, especially if they occur day after day or in combination with each other. Examples include: Working long hours. Driving in traffic. Caring for children. Being in debt. Being in a difficult relationship. What are the signs or symptoms? Stress can cause emotional and physical symptoms and can lead to unhealthy behaviors. These include the following: Emotional symptoms Anxiety. This is feeling worried, afraid, on edge, overwhelmed, or out of control. Anger, including irritation or impatience. Depression. This is feeling sad, down, helpless, or guilty. Trouble focusing, remembering, or making decisions. Physical symptoms Aches and pains. These may affect your head, neck, back, stomach, or other areas of your body. Tight muscles or a clenched jaw. Low energy. Trouble sleeping. Unhealthy behaviors Eating to feel better (overeating) or skipping meals. Working too much or putting off tasks. Smoking, drinking alcohol, or using drugs to feel better. How is this diagnosed? A stress disorder is diagnosed through an assessment by your health care provider. A stress disorder may be diagnosed based on: Your symptoms and any stressful life events. Your medical history. Tests to rule out other causes of your symptoms. Depending on your condition, your health  care provider may refer you to a specialist for further evaluation. How is this treated? Stress management techniques are the recommended treatment for stress. Medicine is not typically recommended for treating  stress. Techniques to reduce your reaction to stressful life events include: Identifying stress. Monitor yourself for symptoms of stress and notice what causes stress for you. These skills may help you to avoid or prepare for stressful events. Managing time. Set your priorities, keep a calendar of events, and learn to say no. These actions can help you avoid taking on too much. Techniques for dealing with stress include: Rethinking the problem. Try to think realistically about stressful events rather than ignoring them or overreacting. Try to find the positives in a stressful situation rather than focusing on the negatives. Exercise. Physical exercise can release both physical and emotional tension. The key is to find a form of exercise that you enjoy and do it regularly. Relaxation techniques. These relax the body and mind. Find one or more that you enjoy and use the techniques regularly. Examples include: Meditation, deep breathing, or progressive relaxation techniques. Yoga or tai chi. Biofeedback, mindfulness techniques, or journaling. Listening to music, being in nature, or taking part in other hobbies. Practicing a healthy lifestyle. Eat a balanced diet, drink plenty of water, limit or avoid caffeine, and get plenty of sleep. Having a strong support network. Spend time with family, friends, or other people you enjoy being around. Express your feelings and talk things over with someone you trust. Counseling or talk therapy with a mental health provider may help if you are having trouble managing stress by yourself. Follow these instructions at home: Lifestyle  Avoid drugs. Do not use any products that contain nicotine or tobacco. These products include cigarettes, chewing tobacco, and vaping devices, such as e-cigarettes. If you need help quitting, ask your health care provider. If you drink alcohol: Limit how much you have to: 0-1 drink a day for women who are not pregnant. 0-2 drinks a  day for men. Know how much alcohol is in a drink. In the [place] one drink equals one 12 oz bottle of beer (355 mL), one 5 oz glass of wine (148 mL), or one 1 oz glass of hard liquor (44 mL). Do not use alcohol or drugs to relax. Eat a balanced diet that includes fresh fruits and vegetables, whole grains, lean meats, fish, eggs, beans, and low-fat dairy. Avoid processed foods and foods high in added fat, sugar, and salt. Exercise at least 30 minutes on 5 or more days each week. Get 7-8 hours of sleep each night. General instructions  Practice stress management techniques as told by your health care provider. Drink enough fluid to keep your urine pale yellow. Take over-the-counter and prescription medicines only as told by your health care provider. Keep all follow-up visits. This is important. Contact a health care provider if: Your symptoms get worse. You have new symptoms. You feel overwhelmed by your problems and can no longer manage them by yourself. Get help right away if: You have thoughts of hurting yourself or others. Get help right awayif you feel like you may hurt yourself or others, or have thoughts about taking your own life. Go to your nearest emergency room or: Call 911. Call the [facility] at [phone] or 988. This is open 24 hours a day. Text the Crisis Text Line at 657-846-6568. Summary Stress is a normal reaction to life events. It can cause problems if it happens too often  or for too long. Practicing stress management techniques is the best way to treat stress. Counseling or talk therapy with a mental health provider may help if you are having trouble managing stress by yourself. This information is not intended to replace advice given to you by your health care provider. Make sure you discuss any questions you have with your health care provider. Document Revised: 03/08/2021 Document Reviewed: 03/08/2021 Elsevier Patient Education  2022  Elsevier Inc[employer]   Preventive Care 66-9 Years Old, Male Preventive care refers to lifestyle choices and visits with your health care provider that can promote health and wellness. Preventive care visits are also called wellness exams. What can I expect for my preventive care visit? Counseling During your preventive care visit, your health care provider may ask about your: Medical history, including: Past medical problems. Family medical history. Current health, including: Emotional well-being. Home life and relationship well-being. Sexual activity. Lifestyle, including: Alcohol, nicotine or tobacco, and drug use. Access to firearms. Diet, exercise, and sleep habits. Safety issues such as seatbelt and bike helmet use. Sunscreen use. Work and work Statistician. Physical exam Your health care provider will check your: Height and weight. These may be used to calculate your BMI (body mass index). BMI is a measurement that tells if you are at a healthy weight. Waist circumference. This measures the distance around your waistline. This measurement also tells if you are at a healthy weight and may help predict your risk of certain diseases, such as type 2 diabetes and high blood pressure. Heart rate and blood pressure. Body temperature. Skin for abnormal spots. What immunizations do I need? Vaccines are usually given at various ages, according to a schedule. Your health care provider will recommend vaccines for you based on your age, medical history, and lifestyle or other factors, such as travel or where you work. What tests do I need? Screening Your health care provider may recommend screening tests for certain conditions. This may include: Lipid and cholesterol levels. Diabetes screening. This is done by checking your blood sugar (glucose) after you have not eaten for a while (fasting). Hepatitis B test. Hepatitis C test. HIV (human immunodeficiency virus) test. STI (sexually  transmitted infection) testing, if you are at risk. Lung cancer screening. Prostate cancer screening. Colorectal cancer screening. Talk with your health care provider about your test results, treatment options, and if necessary, the need for more tests. Follow these instructions at home: Eating and drinking  Eat a diet that includes fresh fruits and vegetables, whole grains, lean protein, and low-fat dairy products. Take vitamin and mineral supplements as recommended by your health care provider. Do not drink alcohol if your health care provider tells you not to drink. If you drink alcohol: Limit how much you have to 0-2 drinks a day. Know how much alcohol is in your drink. In the [place] one drink equals one 12 oz bottle of beer (355 mL), one 5 oz glass of wine (148 mL), or one 1 oz glass of hard liquor (44 mL). Lifestyle Brush your teeth every morning and night with fluoride toothpaste. Floss one time each day. Exercise for at least 30 minutes 5 or more days each week. Do not use any products that contain nicotine or tobacco. These products include cigarettes, chewing tobacco, and vaping devices, such as e-cigarettes. If you need help quitting, ask your health care provider. Do not use drugs. If you are sexually active, practice safe sex. Use a condom or other form of protection to prevent  STIs. Take aspirin only as told by your health care provider. Make sure that you understand how much to take and what form to take. Work with your health care provider to find out whether it is safe and beneficial for you to take aspirin daily. Find healthy ways to manage stress, such as: Meditation, yoga, or listening to music. Journaling. Talking to a trusted person. Spending time with friends and family. Minimize exposure to UV radiation to reduce your risk of skin cancer. Safety Always wear your seat belt while driving or riding in a vehicle. Do not drive: If you have been drinking alcohol. Do  not ride with someone who has been drinking. When you are tired or distracted. While texting. If you have been using any mind-altering substances or drugs. Wear a helmet and other protective equipment during sports activities. If you have firearms in your house, make sure you follow all gun safety procedures. What's next? Go to your health care provider once a year for an annual wellness visit. Ask your health care provider how often you should have your eyes and teeth checked. Stay up to date on all vaccines. This information is not intended to replace advice given to you by your health care provider. Make sure you discuss any questions you have with your health care provider. Document Revised: 01/24/2021 Document Reviewed: 01/24/2021 Elsevier Patient Education  2022 [place],   Merri Ray, MD [place] 10/17/21 8:55 AM

## 2021-10-17 NOTE — Patient Instructions (Addendum)
See info on stress, but follow up if you are feeling continued depression symptoms.  Cutting back on alcohol may also help. I will check labs as we discussed and let you know if any concerns.  Take 2 ketoconazole at once for tinea versicolor as we discussed.  If symptoms persist we can extend that course if needed.  Keep me posted and thanks for coming in today.   Tinea Versicolor Tinea versicolor is a common fungal infection. It causes a rash that looks like light or dark patches on the skin. The rash most often occurs on the chest, back, neck, or upper arms. This condition is more common during warm weather. Tinea versicolor usually does not cause any other problems than the rash. In most cases, the infection goes away in a few weeks with treatment. It may take a few months for the patches on your skin to return to your usual skin color. What are the causes? This condition occurs when a certain type of fungus (Malassezia furfur) that is normally present on the skin starts to grow too much. This fungus is a type of yeast. This condition cannot be passed from one person to another (is not contagious). What increases the risk? This condition is more likely to develop when certain factors are present, such as: Heat and humidity. Sweating too much. Hormone changes, such as those that occur when taking birth control pills. Oily skin. A weak disease-fighting system (immunesystem). What are the signs or symptoms? Symptoms of this condition include: A rash of light or dark patches on your skin. The rash may have: Patches of tan or pink spots (on light skin). Patches of white or brown spots (on dark skin). Patches of skin that do not tan. Well-marked edges. Scales on the discolored areas. Mild itching. There may also be no itching. How is this diagnosed? A health care provider can usually diagnose this condition by looking at your skin. During the exam, he or she may use ultraviolet (UV) light to  see how much of your skin has been affected. In some cases, a skin sample may be taken by scraping the rash. This sample will be viewed under a microscope to check for yeast overgrowth. How is this treated? Treatment for this condition may include: Dandruff shampoo that is applied to the affected skin during showers or bathing. Over-the-counter medicated skin cream, lotion, or soaps. Prescription antifungal medicine in the form of skin cream or pills. Medicine to help reduce itching. Follow these instructions at home: Use over-the-counter and prescription medicines only as told by your health care provider. Apply dandruff shampoo to the affected area as told by your health care provider. Do not scratch the affected area of skin. Avoid hot and humid conditions. Do not use tanning booths. Try to avoid sweating a lot. Contact a health care provider if: Your symptoms get worse. You have a fever. You have signs of infection such as: Redness, swelling, or pain at the site of your rash. Warmth coming from your rash. Fluid or blood coming from your rash. Pus or a bad smell coming from your rash. Your rash comes back (recurs) after treatment. Your rash does not improve with treatment and spreads to other parts of the body. Summary Tinea versicolor is a common fungal infection of the skin. It causes a rash that looks like light or dark patches on the skin. The rash most often occurs on the chest, back, neck, or upper arms. A health care provider can usually  diagnose this condition by looking at your skin. Treatment may include applying shampoo to the skin and taking or applying medicines. This information is not intended to replace advice given to you by your health care provider. Make sure you discuss any questions you have with your health care provider. Document Revised: 10/17/2020 Document Reviewed: 10/17/2020 Elsevier Patient Education  2022 [place], Adult Stress is a  normal reaction to life events. Stress is what you feel when life demands more than you are used to, or more than you think you can handle. Some stress can be useful, such as studying for a test or meeting a deadline at work. Stress that occurs too often or for too long can cause problems. Long-lasting stress is called chronic stress. Chronic stress can affect your emotional health and interfere with relationships and normal daily activities. Too much stress can weaken your body's defense system (immune system) and increase your risk for physical illness. If you already have a medical problem, stress can make it worse. What are the causes? All sorts of life events can cause stress. An event that causes stress for one person may not be stressful for someone else. Major life events, whether positive or negative, commonly cause stress. Examples include: Losing a job or starting a new job. Losing a loved one. Moving to a new town or home. Getting married or divorced. Having a baby. Getting injured or sick. Less obvious life events can also cause stress, especially if they occur day after day or in combination with each other. Examples include: Working long hours. Driving in traffic. Caring for children. Being in debt. Being in a difficult relationship. What are the signs or symptoms? Stress can cause emotional and physical symptoms and can lead to unhealthy behaviors. These include the following: Emotional symptoms Anxiety. This is feeling worried, afraid, on edge, overwhelmed, or out of control. Anger, including irritation or impatience. Depression. This is feeling sad, down, helpless, or guilty. Trouble focusing, remembering, or making decisions. Physical symptoms Aches and pains. These may affect your head, neck, back, stomach, or other areas of your body. Tight muscles or a clenched jaw. Low energy. Trouble sleeping. Unhealthy behaviors Eating to feel better (overeating) or skipping  meals. Working too much or putting off tasks. Smoking, drinking alcohol, or using drugs to feel better. How is this diagnosed? A stress disorder is diagnosed through an assessment by your health care provider. A stress disorder may be diagnosed based on: Your symptoms and any stressful life events. Your medical history. Tests to rule out other causes of your symptoms. Depending on your condition, your health care provider may refer you to a specialist for further evaluation. How is this treated? Stress management techniques are the recommended treatment for stress. Medicine is not typically recommended for treating stress. Techniques to reduce your reaction to stressful life events include: Identifying stress. Monitor yourself for symptoms of stress and notice what causes stress for you. These skills may help you to avoid or prepare for stressful events. Managing time. Set your priorities, keep a calendar of events, and learn to say no. These actions can help you avoid taking on too much. Techniques for dealing with stress include: Rethinking the problem. Try to think realistically about stressful events rather than ignoring them or overreacting. Try to find the positives in a stressful situation rather than focusing on the negatives. Exercise. Physical exercise can release both physical and emotional tension. The key is to find a form of exercise  that you enjoy and do it regularly. Relaxation techniques. These relax the body and mind. Find one or more that you enjoy and use the techniques regularly. Examples include: Meditation, deep breathing, or progressive relaxation techniques. Yoga or tai chi. Biofeedback, mindfulness techniques, or journaling. Listening to music, being in nature, or taking part in other hobbies. Practicing a healthy lifestyle. Eat a balanced diet, drink plenty of water, limit or avoid caffeine, and get plenty of sleep. Having a strong support network. Spend time with  family, friends, or other people you enjoy being around. Express your feelings and talk things over with someone you trust. Counseling or talk therapy with a mental health provider may help if you are having trouble managing stress by yourself. Follow these instructions at home: Lifestyle  Avoid drugs. Do not use any products that contain nicotine or tobacco. These products include cigarettes, chewing tobacco, and vaping devices, such as e-cigarettes. If you need help quitting, ask your health care provider. If you drink alcohol: Limit how much you have to: 0-1 drink a day for women who are not pregnant. 0-2 drinks a day for men. Know how much alcohol is in a drink. In the [place] one drink equals one 12 oz bottle of beer (355 mL), one 5 oz glass of wine (148 mL), or one 1 oz glass of hard liquor (44 mL). Do not use alcohol or drugs to relax. Eat a balanced diet that includes fresh fruits and vegetables, whole grains, lean meats, fish, eggs, beans, and low-fat dairy. Avoid processed foods and foods high in added fat, sugar, and salt. Exercise at least 30 minutes on 5 or more days each week. Get 7-8 hours of sleep each night. General instructions  Practice stress management techniques as told by your health care provider. Drink enough fluid to keep your urine pale yellow. Take over-the-counter and prescription medicines only as told by your health care provider. Keep all follow-up visits. This is important. Contact a health care provider if: Your symptoms get worse. You have new symptoms. You feel overwhelmed by your problems and can no longer manage them by yourself. Get help right away if: You have thoughts of hurting yourself or others. Get help right awayif you feel like you may hurt yourself or others, or have thoughts about taking your own life. Go to your nearest emergency room or: Call 911. Call the [place] at (361)613-8105 or 988. This is open 24  hours a day. Text the Crisis Text Line at (617)397-9355. Summary Stress is a normal reaction to life events. It can cause problems if it happens too often or for too long. Practicing stress management techniques is the best way to treat stress. Counseling or talk therapy with a mental health provider may help if you are having trouble managing stress by yourself. This information is not intended to replace advice given to you by your health care provider. Make sure you discuss any questions you have with your health care provider. Document Revised: 03/08/2021 Document Reviewed: 03/08/2021 Elsevier Patient Education  2022 [employer]   Preventive Care 66-15 Years Old, Male Preventive care refers to lifestyle choices and visits with your health care provider that can promote health and wellness. Preventive care visits are also called wellness exams. What can I expect for my preventive care visit? Counseling During your preventive care visit, your health care provider may ask about your: Medical history, including: Past medical problems. Family medical history. Current health, including: Emotional well-being. Home  life and relationship well-being. Sexual activity. Lifestyle, including: Alcohol, nicotine or tobacco, and drug use. Access to firearms. Diet, exercise, and sleep habits. Safety issues such as seatbelt and bike helmet use. Sunscreen use. Work and work Statistician. Physical exam Your health care provider will check your: Height and weight. These may be used to calculate your BMI (body mass index). BMI is a measurement that tells if you are at a healthy weight. Waist circumference. This measures the distance around your waistline. This measurement also tells if you are at a healthy weight and may help predict your risk of certain diseases, such as type 2 diabetes and high blood pressure. Heart rate and blood pressure. Body temperature. Skin for abnormal spots. What immunizations  do I need? Vaccines are usually given at various ages, according to a schedule. Your health care provider will recommend vaccines for you based on your age, medical history, and lifestyle or other factors, such as travel or where you work. What tests do I need? Screening Your health care provider may recommend screening tests for certain conditions. This may include: Lipid and cholesterol levels. Diabetes screening. This is done by checking your blood sugar (glucose) after you have not eaten for a while (fasting). Hepatitis B test. Hepatitis C test. HIV (human immunodeficiency virus) test. STI (sexually transmitted infection) testing, if you are at risk. Lung cancer screening. Prostate cancer screening. Colorectal cancer screening. Talk with your health care provider about your test results, treatment options, and if necessary, the need for more tests. Follow these instructions at home: Eating and drinking  Eat a diet that includes fresh fruits and vegetables, whole grains, lean protein, and low-fat dairy products. Take vitamin and mineral supplements as recommended by your health care provider. Do not drink alcohol if your health care provider tells you not to drink. If you drink alcohol: Limit how much you have to 0-2 drinks a day. Know how much alcohol is in your drink. In the [place] one drink equals one 12 oz bottle of beer (355 mL), one 5 oz glass of wine (148 mL), or one 1 oz glass of hard liquor (44 mL). Lifestyle Brush your teeth every morning and night with fluoride toothpaste. Floss one time each day. Exercise for at least 30 minutes 5 or more days each week. Do not use any products that contain nicotine or tobacco. These products include cigarettes, chewing tobacco, and vaping devices, such as e-cigarettes. If you need help quitting, ask your health care provider. Do not use drugs. If you are sexually active, practice safe sex. Use a condom or other form of protection to  prevent STIs. Take aspirin only as told by your health care provider. Make sure that you understand how much to take and what form to take. Work with your health care provider to find out whether it is safe and beneficial for you to take aspirin daily. Find healthy ways to manage stress, such as: Meditation, yoga, or listening to music. Journaling. Talking to a trusted person. Spending time with friends and family. Minimize exposure to UV radiation to reduce your risk of skin cancer. Safety Always wear your seat belt while driving or riding in a vehicle. Do not drive: If you have been drinking alcohol. Do not ride with someone who has been drinking. When you are tired or distracted. While texting. If you have been using any mind-altering substances or drugs. Wear a helmet and other protective equipment during sports activities. If you have firearms in your house,  make sure you follow all gun safety procedures. What's next? Go to your health care provider once a year for an annual wellness visit. Ask your health care provider how often you should have your eyes and teeth checked. Stay up to date on all vaccines. This information is not intended to replace advice given to you by your health care provider. Make sure you discuss any questions you have with your health care provider. Document Revised: 01/24/2021 Document Reviewed: 01/24/2021 Elsevier Patient Education  [place].

## 2021-10-31 LAB — COLOGUARD: COLOGUARD: NEGATIVE

## 2022-10-21 ENCOUNTER — Ambulatory Visit (INDEPENDENT_AMBULATORY_CARE_PROVIDER_SITE_OTHER): Payer: Commercial Managed Care - PPO | Admitting: Family Medicine

## 2022-10-21 ENCOUNTER — Encounter: Payer: Self-pay | Admitting: Family Medicine

## 2022-10-21 VITALS — BP 122/64 | HR 63 | Temp 98.0°F | Ht 68.75 in | Wt 156.4 lb

## 2022-10-21 DIAGNOSIS — Z130 Encounter for screening for diseases of the blood and blood-forming organs and certain disorders involving the immune mechanism: Secondary | ICD-10-CM

## 2022-10-21 DIAGNOSIS — Z131 Encounter for screening for diabetes mellitus: Secondary | ICD-10-CM

## 2022-10-21 DIAGNOSIS — Z125 Encounter for screening for malignant neoplasm of prostate: Secondary | ICD-10-CM | POA: Diagnosis not present

## 2022-10-21 DIAGNOSIS — Z23 Encounter for immunization: Secondary | ICD-10-CM | POA: Diagnosis not present

## 2022-10-21 DIAGNOSIS — Z13220 Encounter for screening for lipoid disorders: Secondary | ICD-10-CM

## 2022-10-21 DIAGNOSIS — Z808 Family history of malignant neoplasm of other organs or systems: Secondary | ICD-10-CM

## 2022-10-21 DIAGNOSIS — L720 Epidermal cyst: Secondary | ICD-10-CM

## 2022-10-21 DIAGNOSIS — Z0000 Encounter for general adult medical examination without abnormal findings: Secondary | ICD-10-CM

## 2022-10-21 DIAGNOSIS — B360 Pityriasis versicolor: Secondary | ICD-10-CM

## 2022-10-21 LAB — CBC WITH DIFFERENTIAL/PLATELET
Basophils Absolute: 0 10*3/uL (ref 0.0–0.1)
Basophils Relative: 0.6 % (ref 0.0–3.0)
Eosinophils Absolute: 0.2 10*3/uL (ref 0.0–0.7)
Eosinophils Relative: 3.2 % (ref 0.0–5.0)
HCT: 44.6 % (ref 39.0–52.0)
Hemoglobin: 15 g/dL (ref 13.0–17.0)
Lymphocytes Relative: 42.5 % (ref 12.0–46.0)
Lymphs Abs: 2 10*3/uL (ref 0.7–4.0)
MCHC: 33.6 g/dL (ref 30.0–36.0)
MCV: 87.1 fl (ref 78.0–100.0)
Monocytes Absolute: 0.4 10*3/uL (ref 0.1–1.0)
Monocytes Relative: 8.7 % (ref 3.0–12.0)
Neutro Abs: 2.1 10*3/uL (ref 1.4–7.7)
Neutrophils Relative %: 45 % (ref 43.0–77.0)
Platelets: 231 10*3/uL (ref 150.0–400.0)
RBC: 5.12 Mil/uL (ref 4.22–5.81)
RDW: 13 % (ref 11.5–15.5)
WBC: 4.8 10*3/uL (ref 4.0–10.5)

## 2022-10-21 LAB — COMPREHENSIVE METABOLIC PANEL
ALT: 16 U/L (ref 0–53)
AST: 19 U/L (ref 0–37)
Albumin: 4.6 g/dL (ref 3.5–5.2)
Alkaline Phosphatase: 50 U/L (ref 39–117)
BUN: 17 mg/dL (ref 6–23)
CO2: 31 mEq/L (ref 19–32)
Calcium: 10.3 mg/dL (ref 8.4–10.5)
Chloride: 103 mEq/L (ref 96–112)
Creatinine, Ser: 1.04 mg/dL (ref 0.40–1.50)
GFR: 86.37 mL/min (ref >60.00)
Glucose, Bld: 60 mg/dL — ABNORMAL LOW (ref 70–99)
Potassium: 4.4 mEq/L (ref 3.5–5.1)
Sodium: 142 mEq/L (ref 135–145)
Total Bilirubin: 0.7 mg/dL (ref 0.2–1.2)
Total Protein: 6.5 g/dL (ref 6.0–8.3)

## 2022-10-21 LAB — LIPID PANEL
Cholesterol: 180 mg/dL (ref 0–200)
HDL: 66.5 mg/dL (ref >39.00)
LDL Cholesterol: 98 mg/dL (ref 0–99)
NonHDL: 113.2
Total CHOL/HDL Ratio: 3
Triglycerides: 76 mg/dL (ref 0.0–149.0)
VLDL: 15.2 mg/dL (ref 0.0–40.0)

## 2022-10-21 LAB — HEMOGLOBIN A1C: Hgb A1c MFr Bld: 5.7 % (ref 4.6–6.5)

## 2022-10-21 LAB — PSA: PSA: 0.53 ng/mL (ref 0.10–4.00)

## 2022-10-21 MED ORDER — KETOCONAZOLE 200 MG PO TABS: 400.0000 mg | ORAL_TABLET | Freq: Once | ORAL | 0 refills | Status: AC

## 2022-10-21 NOTE — Progress Notes (Signed)
Subjective:  Patient ID: Dale Webster, male    DOB: 05/14/1976  Age: 47 y.o. MRN: [id number]  CC:  Chief Complaint  Patient presents with   Annual Exam    Pt is fasting, no concerns     HPI Dale Webster presents for Annual Exam  No chronic medications.  No change in health since last visit.    History of tinea versicolor, treated with Nizoral 400 mg last year. Better for a few months, then returned. No se's with meds.   Family history of skin cancers, plan for dermatology follow-up. Has not seen. Nevus on back of scalp larger past few years.  Discussed rest, stress management last year.  No meds.  Plan to cut back on alcohol intake - stress mgt better and only once drink per day.  Borderline LDL last year, otherwise labs normal. Lab Results  Component Value Date   CHOL 174 10/17/2021   HDL 60.00 10/17/2021   LDLCALC 101 (H) 10/17/2021   TRIG 66.0 10/17/2021   CHOLHDL 3 10/17/2021   Denies depressed mood or anhedonia.      10/21/2022    8:06 AM 10/17/2021    8:04 AM 04/18/2020   11:05 AM 02/03/2020    3:21 PM 07/26/2016    8:53 AM  Depression screen PHQ 2/9  Decreased Interest 0 1 0 0 0  Down, Depressed, Hopeless 0 1 0 0 0  PHQ - 2 Score 0 2 0 0 0  Altered sleeping 1 0     Tired, decreased energy 0 1     Change in appetite 0 0     Feeling bad or failure about yourself  0 1     Trouble concentrating 0 1     Moving slowly or fidgety/restless 0 0     Suicidal thoughts 0 0     PHQ-9 Score 1 5       Health Maintenance  Topic Date Due   DTaP/Tdap/Td (2 - Td or Tdap) 08/31/2021   COVID-19 Vaccine (4 - 2023-24 season) 04/12/2022   INFLUENZA VACCINE  11/10/2022 (Originally 03/12/2022)   Hepatitis C Screening  10/21/2023 (Originally 09/23/1994)   HIV Screening  10/21/2023 (Originally 09/24/1991)   COLONOSCOPY (Pts 45-21yr Insurance coverage will need to be confirmed)  10/23/2024   HPV VACCINES  Aged Out  Cologuard negative 10/23/2021 Prostate: does  have family history of  prostate cancer -father The natural history of prostate cancer and ongoing controversy regarding screening and potential treatment outcomes of prostate cancer has been discussed with the patient. The meaning of a false positive PSA and a false negative PSA has been discussed. He indicates understanding of the limitations of this screening test and wishes  to proceed with screening PSA testing. Lab Results  Component Value Date   PSA1 2.6 05/30/2020   PSA1 3.1 04/18/2020   PSA1 0.5 02/03/2020   PSA 0.28 10/17/2021      Immunization History  Administered Date(s) Administered   PFIZER(Purple Top)SARS-COV-2 Vaccination 10/26/2019, 11/23/2019, 07/28/2020   Tdap 09/01/2011  Flu vaccine declined COVID-vaccine initial vaccine, booster discussed, but recent infection - 7 weeks ago. Resolved. No residual sx's.  Tetanus vaccine -due. Plan on trip to [place] this [place].   No results found.  Wears glasses, optho visit one year ago - scheduling appt.   Dental: last week, every 6 months.   Alcohol: 1 drink per day  Tobacco: none.   Exercise: Active with work stair,  ladder work and walking with dogs  twice daily, and 7 min workout daily.   History Patient Active Problem List   Diagnosis Date Noted   Scalp wound 09/11/2011   History reviewed. No pertinent past medical history. Past Surgical History:  Procedure Laterality Date   mole biopsy     benign   No Known Allergies Prior to Admission medications   Not on File   Social History   Socioeconomic History   Marital status: Married    Spouse name: Not on file   Number of children: Not on file   Years of education: Not on file   Highest education level: Not on file  Occupational History   Occupation: Leisure centre manager  Tobacco Use   Smoking status: Never   Smokeless tobacco: Never  Vaping Use   Vaping Use: Never used  Substance and Sexual Activity   Alcohol use: Yes    Alcohol/week: 7.0 standard drinks of alcohol     Types: 7 Cans of beer per week    Comment: 1 beer a day average   Drug use: No   Sexual activity: Yes  Other Topics Concern   Not on file  Social History Narrative   Not on file   Social Determinants of Health   Financial Resource Strain: Not on file  Food Insecurity: Not on file  Transportation Needs: Not on file  Physical Activity: Not on file  Stress: Not on file  Social Connections: Not on file  Intimate Partner Violence: Not on file    Review of Systems   Objective:   Vitals:   10/21/22 0811  BP: 122/64  Pulse: 63  Temp: 98 F (36.7 C)  TempSrc: Temporal  SpO2: 99%  Weight: 156 lb 6.4 oz (70.9 kg)  Height: 5' 8.75" (1.746 m)     Physical Exam Vitals reviewed.  Constitutional:      Appearance: He is well-developed.  HENT:     Head: Normocephalic and atraumatic.     Right Ear: External ear normal.     Left Ear: External ear normal.  Eyes:     Conjunctiva/sclera: Conjunctivae normal.     Pupils: Pupils are equal, round, and reactive to light.  Neck:     Thyroid: No thyromegaly.  Cardiovascular:     Rate and Rhythm: Normal rate and regular rhythm.     Heart sounds: Normal heart sounds.  Pulmonary:     Effort: Pulmonary effort is normal. No respiratory distress.     Breath sounds: Normal breath sounds. No wheezing.  Abdominal:     General: There is no distension.     Palpations: Abdomen is soft.     Tenderness: There is no abdominal tenderness.  Musculoskeletal:        General: No tenderness. Normal range of motion.     Cervical back: Normal range of motion and neck supple.  Lymphadenopathy:     Cervical: No cervical adenopathy.  Skin:    General: Skin is warm and dry.     Comments: Left posterior scalp, cystic area approximately 6 mm.  No appreciable skin changes in area.  Hyperpigmented patches across chest/trunk and back.  Neurological:     Mental Status: He is alert and oriented to person, place, and time.     Deep Tendon Reflexes: Reflexes  are normal and symmetric.  Psychiatric:        Behavior: Behavior normal.        Assessment & Plan:  Dale Webster is a 47 y.o. male . Annual physical exam -  Plan: Hemoglobin A1c, Lipid panel, Comprehensive metabolic panel, CBC with Differential/Platelet, PSA  - -anticipatory guidance as below in AVS, screening labs above. Health maintenance items as above in HPI discussed/recommended as applicable.   Screening for diabetes mellitus - Plan: Hemoglobin A1c, Lipid panel, Comprehensive metabolic panel  Screening, anemia, deficiency, iron - Plan: CBC with Differential/Platelet  Screening for prostate cancer - Plan: PSA  Screening for hyperlipidemia - Plan: Lipid panel, Comprehensive metabolic panel  -Borderline LDL previously.  Need for Tdap vaccination - Plan: Tdap vaccine greater than or equal to 7yo IM  Epidermoid cyst of skin of scalp - Plan: Ambulatory referral to Dermatology Family history of skin cancer - Plan: Ambulatory referral to Dermatology  -Suspected cyst of scalp, reports some increased size over the years.  Refer to dermatology.  Family history of skin cancer.  Screening exam as well.  Tinea versicolor - Plan: ketoconazole (NIZORAL) 200 MG tablet  -Current.  Nizoral 4 mg x 1 with over-the-counter options discussed or RTC precautions if recurs.  May need intermittent dosing.  Meds ordered this encounter  Medications   ketoconazole (NIZORAL) 200 MG tablet    Sig: Take 2 tablets (400 mg total) by mouth once for 1 dose.    Dispense:  2 tablet    Refill:  0   Patient Instructions  I will refer you to dermatology, but scalp area appears to be cyst, not concerning. Nizoral for skin rash - if recurs, try selsun blue or see me again.  If any concerns on labs I will let you know. Keep up the good work with exercise and have a great trip later this year.  Let me know if there are questions.  Tinea Versicolor  Tinea versicolor is a common fungal infection. It causes a  rash that looks like light or dark patches on the skin. The rash most often occurs on the chest, back, neck, or upper arms. This condition is more common during warm weather. Tinea versicolor usually does not cause any other problems than the rash. In most cases, the infection goes away in a few weeks with treatment. It may take a few months for the patches on your skin to return to your usual skin color. What are the causes? This condition occurs when a certain type of fungus (Malassezia furfur) that is normally present on the skin starts to grow too much. This fungus is a type of yeast. This condition cannot be passed from one person to another (is not contagious). What increases the risk? This condition is more likely to develop when certain factors are present, such as: Heat and humidity. Sweating too much. Hormone changes, such as those that occur when taking birth control pills. Oily skin. A weak disease-fighting system (immunesystem). What are the signs or symptoms? Symptoms of this condition include: A rash of light or dark patches on your skin. The rash may have: Patches of tan or pink spots (on light skin). Patches of white or brown spots (on dark skin). Patches of skin that do not tan. Well-marked edges. Scales on the discolored areas. Mild itching. There may also be no itching. How is this diagnosed? A health care provider can usually diagnose this condition by looking at your skin. During the exam, he or she may use ultraviolet (UV) light to see how much of your skin has been affected. In some cases, a skin sample may be taken by scraping the rash. This sample will be viewed under a microscope to check for  yeast overgrowth. How is this treated? Treatment for this condition may include: Dandruff shampoo that is applied to the affected skin during showers or bathing. Over-the-counter medicated skin cream, lotion, or soaps. Prescription antifungal medicine in the form of skin  cream or pills. Medicine to help reduce itching. Follow these instructions at home: Use over-the-counter and prescription medicines only as told by your health care provider. Apply dandruff shampoo to the affected area as told by your health care provider. Do not scratch the affected area of skin. Avoid hot and humid conditions. Do not use tanning booths. Try to avoid sweating a lot. Contact a health care provider if: Your symptoms get worse. You have a fever. You have signs of infection such as: Redness, swelling, or pain at the site of your rash. Warmth coming from your rash. Fluid or blood coming from your rash. Pus or a bad smell coming from your rash. Your rash comes back (recurs) after treatment. Your rash does not improve with treatment and spreads to other parts of the body. Summary Tinea versicolor is a common fungal infection of the skin. It causes a rash that looks like light or dark patches on the skin. The rash most often occurs on the chest, back, neck, or upper arms. A health care provider can usually diagnose this condition by looking at your skin. Treatment may include applying shampoo to the skin and taking or applying medicines. This information is not intended to replace advice given to you by your health care provider. Make sure you discuss any questions you have with your health care provider. Document Revised: 10/17/2020 Document Reviewed: 10/17/2020 Elsevier Patient Education  2023 [employer]   Preventive Care 61-31 Years Old, Male Preventive care refers to lifestyle choices and visits with your health care provider that can promote health and wellness. Preventive care visits are also called wellness exams. What can I expect for my preventive care visit? Counseling During your preventive care visit, your health care provider may ask about your: Medical history, including: Past medical problems. Family medical history. Current health,  including: Emotional well-being. Home life and relationship well-being. Sexual activity. Lifestyle, including: Alcohol, nicotine or tobacco, and drug use. Access to firearms. Diet, exercise, and sleep habits. Safety issues such as seatbelt and bike helmet use. Sunscreen use. Work and work Statistician. Physical exam Your health care provider will check your: Height and weight. These may be used to calculate your BMI (body mass index). BMI is a measurement that tells if you are at a healthy weight. Waist circumference. This measures the distance around your waistline. This measurement also tells if you are at a healthy weight and may help predict your risk of certain diseases, such as type 2 diabetes and high blood pressure. Heart rate and blood pressure. Body temperature. Skin for abnormal spots. What immunizations do I need?  Vaccines are usually given at various ages, according to a schedule. Your health care provider will recommend vaccines for you based on your age, medical history, and lifestyle or other factors, such as travel or where you work. What tests do I need? Screening Your health care provider may recommend screening tests for certain conditions. This may include: Lipid and cholesterol levels. Diabetes screening. This is done by checking your blood sugar (glucose) after you have not eaten for a while (fasting). Hepatitis B test. Hepatitis C test. HIV (human immunodeficiency virus) test. STI (sexually transmitted infection) testing, if you are at risk. Lung cancer screening. Prostate cancer screening. Colorectal cancer  screening. Talk with your health care provider about your test results, treatment options, and if necessary, the need for more tests. Follow these instructions at home: Eating and drinking  Eat a diet that includes fresh fruits and vegetables, whole grains, lean protein, and low-fat dairy products. Take vitamin and mineral supplements as recommended  by your health care provider. Do not drink alcohol if your health care provider tells you not to drink. If you drink alcohol: Limit how much you have to 0-2 drinks a day. Know how much alcohol is in your drink. In the [place] one drink equals one 12 oz bottle of beer (355 mL), one 5 oz glass of wine (148 mL), or one 1 oz glass of hard liquor (44 mL). Lifestyle Brush your teeth every morning and night with fluoride toothpaste. Floss one time each day. Exercise for at least 30 minutes 5 or more days each week. Do not use any products that contain nicotine or tobacco. These products include cigarettes, chewing tobacco, and vaping devices, such as e-cigarettes. If you need help quitting, ask your health care provider. Do not use drugs. If you are sexually active, practice safe sex. Use a condom or other form of protection to prevent STIs. Take aspirin only as told by your health care provider. Make sure that you understand how much to take and what form to take. Work with your health care provider to find out whether it is safe and beneficial for you to take aspirin daily. Find healthy ways to manage stress, such as: Meditation, yoga, or listening to music. Journaling. Talking to a trusted person. Spending time with friends and family. Minimize exposure to UV radiation to reduce your risk of skin cancer. Safety Always wear your seat belt while driving or riding in a vehicle. Do not drive: If you have been drinking alcohol. Do not ride with someone who has been drinking. When you are tired or distracted. While texting. If you have been using any mind-altering substances or drugs. Wear a helmet and other protective equipment during sports activities. If you have firearms in your house, make sure you follow all gun safety procedures. What's next? Go to your health care provider once a year for an annual wellness visit. Ask your health care provider how often you should have your eyes and teeth  checked. Stay up to date on all vaccines. This information is not intended to replace advice given to you by your health care provider. Make sure you discuss any questions you have with your health care provider. Document Revised: 01/24/2021 Document Reviewed: 01/24/2021 Elsevier Patient Education  [place],   Merri Ray, MD [place], [facility] 10/21/22 8:35 AM

## 2022-10-21 NOTE — Patient Instructions (Addendum)
I will refer you to dermatology, but scalp area appears to be cyst, not concerning. Nizoral for skin rash - if recurs, try selsun blue or see me again.  If any concerns on labs I will let you know. Keep up the good work with exercise and have a great trip later this year.  Let me know if there are questions.  Tinea Versicolor  Tinea versicolor is a common fungal infection. It causes a rash that looks like light or dark patches on the skin. The rash most often occurs on the chest, back, neck, or upper arms. This condition is more common during warm weather. Tinea versicolor usually does not cause any other problems than the rash. In most cases, the infection goes away in a few weeks with treatment. It may take a few months for the patches on your skin to return to your usual skin color. What are the causes? This condition occurs when a certain type of fungus (Malassezia furfur) that is normally present on the skin starts to grow too much. This fungus is a type of yeast. This condition cannot be passed from one person to another (is not contagious). What increases the risk? This condition is more likely to develop when certain factors are present, such as: Heat and humidity. Sweating too much. Hormone changes, such as those that occur when taking birth control pills. Oily skin. A weak disease-fighting system (immunesystem). What are the signs or symptoms? Symptoms of this condition include: A rash of light or dark patches on your skin. The rash may have: Patches of tan or pink spots (on light skin). Patches of white or brown spots (on dark skin). Patches of skin that do not tan. Well-marked edges. Scales on the discolored areas. Mild itching. There may also be no itching. How is this diagnosed? A health care provider can usually diagnose this condition by looking at your skin. During the exam, he or she may use ultraviolet (UV) light to see how much of your skin has been affected. In some  cases, a skin sample may be taken by scraping the rash. This sample will be viewed under a microscope to check for yeast overgrowth. How is this treated? Treatment for this condition may include: Dandruff shampoo that is applied to the affected skin during showers or bathing. Over-the-counter medicated skin cream, lotion, or soaps. Prescription antifungal medicine in the form of skin cream or pills. Medicine to help reduce itching. Follow these instructions at home: Use over-the-counter and prescription medicines only as told by your health care provider. Apply dandruff shampoo to the affected area as told by your health care provider. Do not scratch the affected area of skin. Avoid hot and humid conditions. Do not use tanning booths. Try to avoid sweating a lot. Contact a health care provider if: Your symptoms get worse. You have a fever. You have signs of infection such as: Redness, swelling, or pain at the site of your rash. Warmth coming from your rash. Fluid or blood coming from your rash. Pus or a bad smell coming from your rash. Your rash comes back (recurs) after treatment. Your rash does not improve with treatment and spreads to other parts of the body. Summary Tinea versicolor is a common fungal infection of the skin. It causes a rash that looks like light or dark patches on the skin. The rash most often occurs on the chest, back, neck, or upper arms. A health care provider can usually diagnose this condition by looking at  your skin. Treatment may include applying shampoo to the skin and taking or applying medicines. This information is not intended to replace advice given to you by your health care provider. Make sure you discuss any questions you have with your health care provider. Document Revised: 10/17/2020 Document Reviewed: 10/17/2020 Elsevier Patient Education  2023 [employer]   Preventive Care 45-56 Years Old, Male Preventive care refers to lifestyle choices  and visits with your health care provider that can promote health and wellness. Preventive care visits are also called wellness exams. What can I expect for my preventive care visit? Counseling During your preventive care visit, your health care provider may ask about your: Medical history, including: Past medical problems. Family medical history. Current health, including: Emotional well-being. Home life and relationship well-being. Sexual activity. Lifestyle, including: Alcohol, nicotine or tobacco, and drug use. Access to firearms. Diet, exercise, and sleep habits. Safety issues such as seatbelt and bike helmet use. Sunscreen use. Work and work Statistician. Physical exam Your health care provider will check your: Height and weight. These may be used to calculate your BMI (body mass index). BMI is a measurement that tells if you are at a healthy weight. Waist circumference. This measures the distance around your waistline. This measurement also tells if you are at a healthy weight and may help predict your risk of certain diseases, such as type 2 diabetes and high blood pressure. Heart rate and blood pressure. Body temperature. Skin for abnormal spots. What immunizations do I need?  Vaccines are usually given at various ages, according to a schedule. Your health care provider will recommend vaccines for you based on your age, medical history, and lifestyle or other factors, such as travel or where you work. What tests do I need? Screening Your health care provider may recommend screening tests for certain conditions. This may include: Lipid and cholesterol levels. Diabetes screening. This is done by checking your blood sugar (glucose) after you have not eaten for a while (fasting). Hepatitis B test. Hepatitis C test. HIV (human immunodeficiency virus) test. STI (sexually transmitted infection) testing, if you are at risk. Lung cancer screening. Prostate cancer  screening. Colorectal cancer screening. Talk with your health care provider about your test results, treatment options, and if necessary, the need for more tests. Follow these instructions at home: Eating and drinking  Eat a diet that includes fresh fruits and vegetables, whole grains, lean protein, and low-fat dairy products. Take vitamin and mineral supplements as recommended by your health care provider. Do not drink alcohol if your health care provider tells you not to drink. If you drink alcohol: Limit how much you have to 0-2 drinks a day. Know how much alcohol is in your drink. In the [place] one drink equals one 12 oz bottle of beer (355 mL), one 5 oz glass of wine (148 mL), or one 1 oz glass of hard liquor (44 mL). Lifestyle Brush your teeth every morning and night with fluoride toothpaste. Floss one time each day. Exercise for at least 30 minutes 5 or more days each week. Do not use any products that contain nicotine or tobacco. These products include cigarettes, chewing tobacco, and vaping devices, such as e-cigarettes. If you need help quitting, ask your health care provider. Do not use drugs. If you are sexually active, practice safe sex. Use a condom or other form of protection to prevent STIs. Take aspirin only as told by your health care provider. Make sure that you understand how much to  take and what form to take. Work with your health care provider to find out whether it is safe and beneficial for you to take aspirin daily. Find healthy ways to manage stress, such as: Meditation, yoga, or listening to music. Journaling. Talking to a trusted person. Spending time with friends and family. Minimize exposure to UV radiation to reduce your risk of skin cancer. Safety Always wear your seat belt while driving or riding in a vehicle. Do not drive: If you have been drinking alcohol. Do not ride with someone who has been drinking. When you are tired or distracted. While  texting. If you have been using any mind-altering substances or drugs. Wear a helmet and other protective equipment during sports activities. If you have firearms in your house, make sure you follow all gun safety procedures. What's next? Go to your health care provider once a year for an annual wellness visit. Ask your health care provider how often you should have your eyes and teeth checked. Stay up to date on all vaccines. This information is not intended to replace advice given to you by your health care provider. Make sure you discuss any questions you have with your health care provider. Document Revised: 01/24/2021 Document Reviewed: 01/24/2021 Elsevier Patient Education  [place].

## 2022-12-29 ENCOUNTER — Encounter: Payer: Self-pay | Admitting: Family Medicine

## 2023-01-14 ENCOUNTER — Ambulatory Visit (INDEPENDENT_AMBULATORY_CARE_PROVIDER_SITE_OTHER): Payer: Commercial Managed Care - PPO | Admitting: Dermatology

## 2023-01-14 ENCOUNTER — Encounter: Payer: Self-pay | Admitting: Dermatology

## 2023-01-14 VITALS — BP 134/87 | HR 71

## 2023-01-14 DIAGNOSIS — L578 Other skin changes due to chronic exposure to nonionizing radiation: Secondary | ICD-10-CM

## 2023-01-14 DIAGNOSIS — D229 Melanocytic nevi, unspecified: Secondary | ICD-10-CM

## 2023-01-14 DIAGNOSIS — L821 Other seborrheic keratosis: Secondary | ICD-10-CM

## 2023-01-14 DIAGNOSIS — L7211 Pilar cyst: Secondary | ICD-10-CM

## 2023-01-14 DIAGNOSIS — Z872 Personal history of diseases of the skin and subcutaneous tissue: Secondary | ICD-10-CM

## 2023-01-14 DIAGNOSIS — Z808 Family history of malignant neoplasm of other organs or systems: Secondary | ICD-10-CM

## 2023-01-14 DIAGNOSIS — D225 Melanocytic nevi of trunk: Secondary | ICD-10-CM

## 2023-01-14 DIAGNOSIS — D1801 Hemangioma of skin and subcutaneous tissue: Secondary | ICD-10-CM

## 2023-01-14 DIAGNOSIS — L814 Other melanin hyperpigmentation: Secondary | ICD-10-CM

## 2023-01-14 DIAGNOSIS — B360 Pityriasis versicolor: Secondary | ICD-10-CM

## 2023-01-14 DIAGNOSIS — D492 Neoplasm of unspecified behavior of bone, soft tissue, and skin: Secondary | ICD-10-CM

## 2023-01-14 DIAGNOSIS — Z1283 Encounter for screening for malignant neoplasm of skin: Secondary | ICD-10-CM | POA: Diagnosis not present

## 2023-01-14 MED ORDER — KETOCONAZOLE 2 % EX CREA: TOPICAL_CREAM | CUTANEOUS | 11 refills | Status: AC

## 2023-01-14 MED ORDER — FLUCONAZOLE 150 MG PO TABS: ORAL_TABLET | ORAL | 0 refills | Status: DC

## 2023-01-14 NOTE — Progress Notes (Signed)
New Patient Visit   Subjective  Dale Webster is a 47 y.o. male who presents for the following: Skin Cancer Screening and Upper Body Skin Exam  The patient presents for Upper Body Skin Exam (UBSE) for skin cancer screening and mole check. The patient has spots, moles and lesions to be evaluated, some may be new or changing and the patient has concerns that these could be cancer.    The following portions of the chart were reviewed this encounter and updated as appropriate: medications, allergies, medical history  Review of Systems:  No other skin or systemic complaints except as noted in HPI or Assessment and Plan.  Objective  Well appearing patient in no apparent distress; mood and affect are within normal limits.  All skin waist up examined. Relevant physical exam findings are noted in the Assessment and Plan.  mid back 6 mm x 4 mm irregular brown macule         Assessment & Plan   FAMILY HISTORY OF SKIN CANCER What type(s):  Melanoma Who affected: Sister   HISTORY OF SPITZ NEVUS No evidence of recurrence today Recommend regular full body skin exams Recommend daily broad spectrum sunscreen SPF 30+ to sun-exposed areas, reapply every 2 hours as needed.  Call if any new or changing lesions are noted between office visits   Neoplasm of skin mid back  Skin / nail biopsy Type of biopsy: tangential   Informed consent: discussed and consent obtained   Anesthesia: the lesion was anesthetized in a standard fashion   Anesthesia comment:  Area prepped with alcohol Anesthetic:  1% lidocaine w/ epinephrine 1-100,000 buffered w/ 8.4% NaHCO3 Instrument used: flexible razor blade   Hemostasis achieved with: pressure, aluminum chloride and electrodesiccation   Outcome: patient tolerated procedure well   Post-procedure details: wound care instructions given   Post-procedure details comment:  Ointment and small bandage applied  Specimen 1 - Surgical pathology Differential  Diagnosis: R/O dysplastic nevus  Check Margins: No   Lentigines, Seborrheic Keratoses, Hemangiomas - Benign normal skin lesions - Benign-appearing - Call for any changes  Melanocytic Nevi - Tan-brown and/or pink-flesh-colored symmetric macules and papules - Benign appearing on exam today - Observation - Call clinic for new or changing moles - Recommend daily use of broad spectrum spf 30+ sunscreen to sun-exposed areas.   Actinic Damage - Chronic condition, secondary to cumulative UV/sun exposure - diffuse scaly erythematous macules with underlying dyspigmentation - Recommend daily broad spectrum sunscreen SPF 30+ to sun-exposed areas, reapply every 2 hours as needed.  - Staying in the shade or wearing long sleeves, sun glasses (UVA+UVB protection) and wide brim hats (4-inch brim around the entire circumference of the hat) are also recommended for sun protection.  - Call for new or changing lesions.  Skin cancer screening performed today.  Pilar Cyst Exam: Subcutaneous nodule at left scalp.  Benign-appearing. Exam most consistent with a pilar cyst. Discussed that a cyst is a benign growth that can grow over time and sometimes get irritated or inflamed. Recommend observation if it is not bothersome. Discussed option of surgical excision to remove it if it is growing, symptomatic, or other changes noted. Please call for new or changing lesions so they can be evaluated.   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 Condition is bothersome/symptomatic for patient. Currently flared.  Start Fluconazole 150 mg once a week for 1 month  Apply Ketoconazole cream twice a day for 1 month  then once a month as maintenance. Advised it can take many months for affected areas to repigment.    Tinea versicolor is a chronic recurrent skin rash causing discolored scaly spots most commonly seen on back, chest, and/or shoulders.  It is  generally asymptomatic. The rash is due to overgrowth of a common type of yeast present on everyone's skin and it is not contagious.  It tends to flare more in the summer due to increased sweating on trunk.  After rash is treated, the scaliness will resolve, but the discoloration will take longer to return to normal pigmentation. The periodic use of an OTC medicated soap/shampoo with zinc or selenium sulfide can be helpful to prevent yeast overgrowth and recurrence.   Side effects of fluconazole (diflucan) include nausea, diarrhea, headache, dizziness, taste changes, rare risk of irritation of the liver, allergy, or decreased blood counts (which could show up as infection or tiredness).   Return in about 1 year (around 01/14/2024) for TBSE.  I, Lawson Radar, CMA, am acting as scribe for Langston Reusing, DO.   Documentation: I have reviewed the above documentation for accuracy and completeness, and I agree with the above.  Langston Reusing, DO

## 2023-01-14 NOTE — Patient Instructions (Addendum)
Start Fluconazole 150 mg once a week for 1 month  Apply Ketoconazole cream twice a day for 1 month then once a month as maintenance. Advised it can take many months for affected areas to repigment.  Side effects of fluconazole (diflucan) include nausea, diarrhea, headache, dizziness, taste changes, rare risk of irritation of the liver, allergy, or decreased blood counts (which could show up as infection or tiredness).     Patient Handout: Wound Care for Skin Biopsy Site  Patient Handout: Wound Care for Skin Biopsy Site  Taking Care of Your Skin Biopsy Site  Proper care of the biopsy site is essential for promoting healing and minimizing scarring. This handout provides instructions on how to care for your biopsy site to ensure optimal recovery.  1. Cleaning the Wound:  Clean the biopsy site daily with gentle soap and water. Gently pat the area dry with a clean, soft towel. Avoid harsh scrubbing or rubbing the area, as this can irritate the skin and delay healing.  2. Applying Aquaphor and Bandage:  After cleaning the wound, apply a thin layer of Aquaphor ointment to the biopsy site. Cover the area with a sterile bandage to protect it from dirt, bacteria, and friction. Change the bandage daily or as needed if it becomes soiled or wet.  3. Continued Care for One Week:  Repeat the cleaning, Aquaphor application, and bandaging process daily for one week following the biopsy procedure. Keeping the wound clean and moist during this initial healing period will help prevent infection and promote optimal healing.  4. Massaging Aquaphor into the Area:  ---After one week, discontinue the use of bandages but continue to apply Aquaphor to the biopsy site. ----Gently massage the Aquaphor into the area using circular motions. ---Massaging the skin helps to promote circulation and prevent the formation of scar tissue.   Additional Tips:  Avoid exposing the biopsy site to direct sunlight  during the healing process, as this can cause hyperpigmentation or worsen scarring. If you experience any signs of infection, such as increased redness, swelling, warmth, or drainage from the wound, contact your healthcare provider immediately. Follow any additional instructions provided by your healthcare provider for caring for the biopsy site and managing any discomfort. Conclusion:  Taking proper care of your skin biopsy site is crucial for ensuring optimal healing and minimizing scarring. By following these instructions for cleaning, applying Aquaphor, and massaging the area, you can promote a smooth and successful recovery. If you have any questions or concerns about caring for your biopsy site, don't hesitate to contact your healthcare provider for guidance.    Skin Education :   I counseled the patient regarding the following: Sun screen (SPF 30 or greater) should be applied during peak UV exposure (between 10am and 2pm) and reapplied after exercise or swimming.  The ABCDEs of melanoma were reviewed with the patient, and the importance of monthly self-examination of moles was emphasized. Should any moles change in shape or color, or itch, bleed or burn, pt will contact our office for evaluation sooner then their interval appointment.  Plan: Sunscreen Recommendations I recommended a broad spectrum sunscreen with a SPF of 30 or higher. I explained that SPF 30 sunscreens block approximately 97 percent of the sun's harmful rays. Sunscreens should be applied at least 15 minutes prior to expected sun exposure and then every 2 hours after that as long as sun exposure continues. If swimming or exercising sunscreen should be reapplied every 45 minutes to an hour after getting  wet or sweating. One ounce, or the equivalent of a shot glass full of sunscreen, is adequate to protect the skin not covered by a bathing suit. I also recommended a lip balm with a sunscreen as well. Sun protective clothing can  be used in lieu of sunscreen but must be worn the entire time you are exposed to the sun's rays.    Due to recent changes in healthcare laws, you may see results of your pathology and/or laboratory studies on MyChart before the doctors have had a chance to review them. We understand that in some cases there may be results that are confusing or concerning to you. Please understand that not all results are received at the same time and often the doctors may need to interpret multiple results in order to provide you with the best plan of care or course of treatment. Therefore, we ask that you please give [place] 2 business days to thoroughly review all your results before contacting the office for clarification. Should we see a critical lab result, you will be contacted sooner.   If You Need Anything After Your Visit  If you have any questions or concerns for your doctor, please call our main line at [phone] If no one answers, please leave a voicemail as directed and we will return your call as soon as possible. Messages left after 4 pm will be answered the following business day.   You may also send [place] a message via MyChart. We typically respond to MyChart messages within 1-2 business days.  For prescription refills, please ask your pharmacy to contact our office. Our fax number is [phone].  If you have an urgent issue when the clinic is closed that cannot wait until the next business day, you can page your doctor at the number below.    Please note that while we do our best to be available for urgent issues outside of office hours, we are not available 24/7.   If you have an urgent issue and are unable to reach [place], you may choose to seek medical care at your doctor's office, retail clinic, urgent care center, or emergency room.  If you have a medical emergency, please immediately call 911 or go to the emergency department. In the event of inclement weather, please call our main line at  [phone] for an update on the status of any delays or closures.  Dermatology Medication Tips: Please keep the boxes that topical medications come in in order to help keep track of the instructions about where and how to use these. Pharmacies typically print the medication instructions only on the boxes and not directly on the medication tubes.   If your medication is too expensive, please contact our office at [phone] or send [place] a message through MyChart.   We are unable to tell what your co-pay for medications will be in advance as this is different depending on your insurance coverage. However, we may be able to find a substitute medication at lower cost or fill out paperwork to get insurance to cover a needed medication.   If a prior authorization is required to get your medication covered by your insurance company, please allow [place] 1-2 business days to complete this process.  Drug prices often vary depending on where the prescription is filled and some pharmacies may offer cheaper prices.  The website www.goodrx.com contains coupons for medications through different pharmacies. The prices here do not account for what the cost may be with help from  insurance (it may be cheaper with your insurance), but the website can give you the price if you did not use any insurance.  - You can print the associated coupon and take it with your prescription to the pharmacy.  - You may also stop by our office during regular business hours and pick up a GoodRx coupon card.  - If you need your prescription sent electronically to a different pharmacy, notify our office through [facility] or by phone at [phone]

## 2023-01-21 NOTE — Progress Notes (Signed)
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

## 2023-01-23 ENCOUNTER — Encounter: Payer: Self-pay | Admitting: Dermatology

## 2023-01-23 ENCOUNTER — Ambulatory Visit (INDEPENDENT_AMBULATORY_CARE_PROVIDER_SITE_OTHER): Payer: Commercial Managed Care - PPO | Admitting: Dermatology

## 2023-01-23 DIAGNOSIS — D225 Melanocytic nevi of trunk: Secondary | ICD-10-CM | POA: Diagnosis not present

## 2023-01-23 DIAGNOSIS — D239 Other benign neoplasm of skin, unspecified: Secondary | ICD-10-CM

## 2023-01-23 MED ORDER — DOXYCYCLINE HYCLATE 100 MG PO CAPS
100.0000 mg | ORAL_CAPSULE | Freq: Two times a day (BID) | ORAL | 0 refills | Status: AC
Start: 2023-01-23 — End: 2023-02-22

## 2023-01-23 MED ORDER — MUPIROCIN 2 % EX OINT
1.0000 | TOPICAL_OINTMENT | Freq: Every day | CUTANEOUS | 0 refills | Status: DC
Start: 2023-01-23 — End: 2023-10-24

## 2023-01-23 NOTE — Patient Instructions (Signed)
Wound Care Instructions  On the day following your surgery, you should begin doing daily dressing changes: Remove the old dressing and discard it. Cleanse the wound gently with tap water. This may be done in the shower or by placing a wet gauze pad directly on the wound and letting it soak for several minutes. It is important to gently remove any dried blood from the wound in order to encourage healing. This may be done by gently rolling a moistened Q-tip on the dried blood. Do not pick at the wound. If the wound should start to bleed, continue cleaning the wound, then place a moist gauze pad on the wound and hold pressure for a few minutes.  Make sure you then dry the skin surrounding the wound completely or the tape will not stick to the skin. Do not use cotton balls on the wound. After the wound is clean and dry, apply the ointment gently with a Q-tip. Cut a non-stick pad to fit the size of the wound. Lay the pad flush to the wound. If the wound is draining, you may want to reinforce it with a small amount of gauze on top of the non-stick pad for a little added compression to the area. Use the tape to seal the area completely. Select from the following with respect to your individual situation: If your wound has been stitched closed: continue the above steps 1-8 at least daily until your sutures are removed. If your wound has been left open to heal: continue steps 1-8 at least daily for the first 3-4 weeks. We would like for you to take a few extra precautions for at least the next week. Sleep with your head elevated on pillows if our wound is on your head. Do not bend over or lift heavy items to reduce the chance of elevated blood pressure to the wound Do not participate in particularly strenuous activities.   Below is a list of dressing supplies you might need.  Cotton-tipped applicators - Q-tips Gauze pads (2x2 and/or 4x4) - All-Purpose Sponges Non-stick dressing material - Telfa Tape -  Paper or Hypafix New and clean tube of petroleum jelly - Vaseline    Comments on Post-Operative Period Slight swelling and redness often appear around the wound. This is normal and will disappear within several days following the surgery. The healing wound will drain a brownish-red-yellow discharge during healing. This is a normal phase of wound healing. As the wound begins to heal, the drainage may increase in amount. Again, this drainage is normal. Notify us if the drainage becomes persistently bloody, excessively swollen, or intensely painful or develops a foul odor or red streaks.  If you should experience mild discomfort during the healing phase, you may take an aspirin-free medication such as Tylenol (acetaminophen). Notify us if the discomfort is severe or persistent. Avoid alcoholic beverages when taking pain medicine.  In Case of Wound Hemorrhage A wound hemorrhage is when the bandage suddenly becomes soaked with bright red blood and flows profusely. If this happens, sit down or lie down with your head elevated. If the wound has a dressing on it, do not remove the dressing. Apply pressure to the existing gauze. If the wound is not covered, use a gauze pad to apply pressure and continue applying the pressure for 20 minutes without peeking. DO NOT COVER THE WOUND WITH A LARGE TOWEL OR WASH CLOTH. Release your hand from the wound site but do not remove the dressing. If the bleeding has stopped,   gently clean around the wound. Leave the dressing in place for 24 hours if possible. This wait time allows the blood vessels to close off so that you do not spark a new round of bleeding by disrupting the newly clotted blood vessels with an immediate dressing change. If the bleeding does not subside, continue to hold pressure. If matters are out of your control, contact an After Hours clinic or go to the Emergency Room.  Due to recent changes in healthcare laws, you may see results of your pathology and/or  laboratory studies on MyChart before the doctors have had a chance to review them. We understand that in some cases there may be results that are confusing or concerning to you. Please understand that not all results are received at the same time and often the doctors may need to interpret multiple results in order to provide you with the best plan of care or course of treatment. Therefore, we ask that you please give us 2 business days to thoroughly review all your results before contacting the office for clarification. Should we see a critical lab result, you will be contacted sooner.   If You Need Anything After Your Visit  If you have any questions or concerns for your doctor, please call our main line at [phone] If no one answers, please leave a voicemail as directed and we will return your call as soon as possible. Messages left after 4 pm will be answered the following business day.   You may also send us a message via MyChart. We typically respond to MyChart messages within 1-2 business days.  For prescription refills, please ask your pharmacy to contact our office. Our fax number is [phone].  If you have an urgent issue when the clinic is closed that cannot wait until the next business day, you can page your doctor at the number below.    Please note that while we do our best to be available for urgent issues outside of office hours, we are not available 24/7.   If you have an urgent issue and are unable to reach us, you may choose to seek medical care at your doctor's office, retail clinic, urgent care center, or emergency room.  If you have a medical emergency, please immediately call 911 or go to the emergency department. In the event of inclement weather, please call our main line at [phone] for an update on the status of any delays or closures.  Dermatology Medication Tips: Please keep the boxes that topical medications come in in order to help keep track of the  instructions about where and how to use these. Pharmacies typically print the medication instructions only on the boxes and not directly on the medication tubes.   If your medication is too expensive, please contact our office at [phone] or send us a message through MyChart.   We are unable to tell what your co-pay for medications will be in advance as this is different depending on your insurance coverage. However, we may be able to find a substitute medication at lower cost or fill out paperwork to get insurance to cover a needed medication.   If a prior authorization is required to get your medication covered by your insurance company, please allow us 1-2 business days to complete this process.  Drug prices often vary depending on where the prescription is filled and some pharmacies may offer cheaper prices.  The website www.goodrx.com contains coupons for medications through different pharmacies. The prices here   do not account for what the cost may be with help from insurance (it may be cheaper with your insurance), but the website can give you the price if you did not use any insurance.  - You can print the associated coupon and take it with your prescription to the pharmacy.  - You may also stop by our office during regular business hours and pick up a GoodRx coupon card.  - If you need your prescription sent electronically to a different pharmacy, notify our office through [facility] MyChart or by phone at [phone]     

## 2023-01-23 NOTE — Progress Notes (Signed)
   Follow-Up Visit   Subjective  Dale Webster is a 47 y.o. 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  Objective  Well appearing patient in no apparent distress; mood and affect are within normal limits.  A focused examination was performed of the following areas: Back   Relevant physical exam findings are noted in the Assessment and Plan.   Mid Back Healing biopsy site    Assessment & Plan   Dysplastic nevus Mid Back  Skin excision - Mid Back  Informed consent: discussed and consent obtained   Timeout: patient name, date of birth, surgical site, and procedure verified   Procedure prep:  Patient was prepped and draped in usual sterile fashion Prep type:  Isopropyl alcohol and povidone-iodine Anesthesia: the lesion was anesthetized in a standard fashion   Anesthetic:  1% lidocaine w/ epinephrine 1-100,000 buffered w/ 8.4% NaHCO3 Instrument used: #15 blade   Hemostasis achieved with: pressure   Hemostasis achieved with comment:  Electrocautery Outcome: patient tolerated procedure well with no complications   Post-procedure details: sterile dressing applied and wound care instructions given   Dressing type: bandage and pressure dressing (mupirocin)    Skin repair - Mid Back Complexity:  Complex Reason for type of repair: reduce tension to allow closure, reduce the risk of dehiscence, infection, and necrosis, reduce subcutaneous dead space and avoid a hematoma, allow closure of the large defect, preserve normal anatomy, preserve normal anatomical and functional relationships and enhance both functionality and cosmetic results   Undermining: area extensively undermined    Undermining comment:  Undermining defect  Subcutaneous layers (deep stitches):  Suture type: Vicryl (polyglactin 910)   Subcutaneous suture technique: inverted dermal. Fine/surface layer approximation (top stitches):  Suture type: nylon   Stitches: simple running   Suture removal (days):  7 Hemostasis achieved with: suture and pressure Outcome: patient tolerated procedure well with no complications   Post-procedure details: sterile dressing applied and wound care instructions given   Dressing type: bandage and pressure dressing (mupirocin)       Return in about 2 weeks (around 02/06/2023) for suture removal.  I, Joanie Coddington, CMA, am acting as scribe for [employer], DO .   Documentation: I have reviewed the above documentation for accuracy and completeness, and I agree with the above.  Langston Reusing, DO

## 2023-01-29 NOTE — Progress Notes (Signed)
Surgical excision pathology report was reviewed and showed clear margins.  No additional treatment required.   We will discuss results with pt at [facility] visit.

## 2023-02-06 ENCOUNTER — Ambulatory Visit (INDEPENDENT_AMBULATORY_CARE_PROVIDER_SITE_OTHER): Payer: Commercial Managed Care - PPO | Admitting: Dermatology

## 2023-02-06 DIAGNOSIS — Z4802 Encounter for removal of sutures: Secondary | ICD-10-CM

## 2023-02-06 NOTE — Patient Instructions (Signed)
Patient Handout: Wound Care for Skin Biopsy Site  Taking Care of Your Skin Biopsy Site  Proper care of the biopsy site is essential for promoting healing and minimizing scarring. This handout provides instructions on how to care for your biopsy site to ensure optimal recovery.  1. Cleaning the Wound:  Clean the biopsy site daily with gentle soap and water. Gently pat the area dry with a clean, soft towel. Avoid harsh scrubbing or rubbing the area, as this can irritate the skin and delay healing.  2. Applying Aquaphor and Bandage:  After cleaning the wound, apply a thin layer of Aquaphor ointment to the biopsy site. Cover the area with a sterile bandage to protect it from dirt, bacteria, and friction. Change the bandage daily or as needed if it becomes soiled or wet.  3. Continued Care for One Week:  Repeat the cleaning, Aquaphor application, and bandaging process daily for one week following the biopsy procedure. Keeping the wound clean and moist during this initial healing period will help prevent infection and promote optimal healing.  4. Massaging Aquaphor into the Area:  ---After one week, discontinue the use of bandages but continue to apply Aquaphor to the biopsy site. ----Gently massage the Aquaphor into the area using circular motions. ---Massaging the skin helps to promote circulation and prevent the formation of scar tissue.   Additional Tips:  Avoid exposing the biopsy site to direct sunlight during the healing process, as this can cause hyperpigmentation or worsen scarring. If you experience any signs of infection, such as increased redness, swelling, warmth, or drainage from the wound, contact your healthcare provider immediately. Follow any additional instructions provided by your healthcare provider for caring for the biopsy site and managing any discomfort. Conclusion:  Taking proper care of your skin biopsy site is crucial for ensuring optimal healing and  minimizing scarring. By following these instructions for cleaning, applying Aquaphor, and massaging the area, you can promote a smooth and successful recovery. If you have any questions or concerns about caring for your biopsy site, don't hesitate to contact your healthcare provider for guidance.    Due to recent changes in healthcare laws, you may see results of your pathology and/or laboratory studies on MyChart before the doctors have had a chance to review them. We understand that in some cases there may be results that are confusing or concerning to you. Please understand that not all results are received at the same time and often the doctors may need to interpret multiple results in order to provide you with the best plan of care or course of treatment. Therefore, we ask that you please give us 2 business days to thoroughly review all your results before contacting the office for clarification. Should we see a critical lab result, you will be contacted sooner.   If You Need Anything After Your Visit  If you have any questions or concerns for your doctor, please call our main line at [phone] If no one answers, please leave a voicemail as directed and we will return your call as soon as possible. Messages left after 4 pm will be answered the following business day.   You may also send us a message via MyChart. We typically respond to MyChart messages within 1-2 business days.  For prescription refills, please ask your pharmacy to contact our office. Our fax number is [phone].  If you have an urgent issue when the clinic is closed that cannot wait until the next business day, you can   page your doctor at the number below.    Please note that while we do our best to be available for urgent issues outside of office hours, we are not available 24/7.   If you have an urgent issue and are unable to reach us, you may choose to seek medical care at your doctor's office, retail clinic, urgent  care center, or emergency room.  If you have a medical emergency, please immediately call 911 or go to the emergency department. In the event of inclement weather, please call our main line at [phone] for an update on the status of any delays or closures.  Dermatology Medication Tips: Please keep the boxes that topical medications come in in order to help keep track of the instructions about where and how to use these. Pharmacies typically print the medication instructions only on the boxes and not directly on the medication tubes.   If your medication is too expensive, please contact our office at [phone] or send us a message through MyChart.   We are unable to tell what your co-pay for medications will be in advance as this is different depending on your insurance coverage. However, we may be able to find a substitute medication at lower cost or fill out paperwork to get insurance to cover a needed medication.   If a prior authorization is required to get your medication covered by your insurance company, please allow us 1-2 business days to complete this process.  Drug prices often vary depending on where the prescription is filled and some pharmacies may offer cheaper prices.  The website www.goodrx.com contains coupons for medications through different pharmacies. The prices here do not account for what the cost may be with help from insurance (it may be cheaper with your insurance), but the website can give you the price if you did not use any insurance.  - You can print the associated coupon and take it with your prescription to the pharmacy.  - You may also stop by our office during regular business hours and pick up a GoodRx coupon card.  - If you need your prescription sent electronically to a different pharmacy, notify our office through [facility] MyChart or by phone at [phone]     

## 2023-02-06 NOTE — Progress Notes (Signed)
   Post-op Visit   Subjective  Dale Webster is a 47 y.o. male who presents for the following: Suture Removal S/P Excision  Patient present today for follow up visit for suture removal s/p excision. Patient was last evaluated on 01/23/23. [facility] reports at [facility]. Patient reports no medication changes.  The following portions of the chart were reviewed this encounter and updated as appropriate: medications, allergies, medical history  Review of Systems:  No other skin or systemic complaints except as noted in HPI or Assessment and Plan.  Objective  Well appearing patient in no apparent distress; mood and affect are within normal limits.  A focused examination was performed of the following areas: Back  Relevant exam findings are noted in the Assessment and Plan.    Assessment & Plan   Suture Removal S/P Excision Exam: wound edges approximated and healing   Treatment Plan: - Pt advised to continue to massage incision daily with Mupirocin ointment daily to prevent worsening scarring and/or Keloid - After 2 weeks, advised he is ok to d/c -We will plan to follow up in 1 year for TBSE  Return in about 1 year (around 02/06/2024) for TBSE.   Documentation: I have reviewed the above documentation for accuracy and completeness, and I agree with the above.  Stasia Cavalier, am acting as scribe for Langston Reusing, DO.  Langston Reusing, DO

## 2023-02-09 ENCOUNTER — Encounter: Payer: Self-pay | Admitting: Dermatology

## 2023-02-10 HISTORY — PX: OTHER SURGICAL HISTORY: SHX169

## 2023-10-24 ENCOUNTER — Ambulatory Visit: Payer: Commercial Managed Care - PPO | Admitting: Family Medicine

## 2023-10-24 ENCOUNTER — Encounter: Payer: Self-pay | Admitting: Family Medicine

## 2023-10-24 VITALS — BP 122/64 | HR 72 | Temp 98.7°F | Ht 68.75 in | Wt 154.6 lb

## 2023-10-24 DIAGNOSIS — Z131 Encounter for screening for diabetes mellitus: Secondary | ICD-10-CM | POA: Diagnosis not present

## 2023-10-24 DIAGNOSIS — Z13220 Encounter for screening for lipoid disorders: Secondary | ICD-10-CM

## 2023-10-24 DIAGNOSIS — Z125 Encounter for screening for malignant neoplasm of prostate: Secondary | ICD-10-CM

## 2023-10-24 DIAGNOSIS — Z130 Encounter for screening for diseases of the blood and blood-forming organs and certain disorders involving the immune mechanism: Secondary | ICD-10-CM | POA: Diagnosis not present

## 2023-10-24 DIAGNOSIS — Z1329 Encounter for screening for other suspected endocrine disorder: Secondary | ICD-10-CM | POA: Diagnosis not present

## 2023-10-24 DIAGNOSIS — Z0000 Encounter for general adult medical examination without abnormal findings: Secondary | ICD-10-CM | POA: Diagnosis not present

## 2023-10-24 LAB — COMPREHENSIVE METABOLIC PANEL
ALT: 18 U/L (ref 0–53)
AST: 20 U/L (ref 0–37)
Albumin: 5.1 g/dL (ref 3.5–5.2)
Alkaline Phosphatase: 43 U/L (ref 39–117)
BUN: 16 mg/dL (ref 6–23)
CO2: 31 meq/L (ref 19–32)
Calcium: 10 mg/dL (ref 8.4–10.5)
Chloride: 102 meq/L (ref 96–112)
Creatinine, Ser: 1.02 mg/dL (ref 0.40–1.50)
GFR: 87.79 mL/min (ref >60.00)
Glucose, Bld: 99 mg/dL (ref 70–99)
Potassium: 4.5 meq/L (ref 3.5–5.1)
Sodium: 142 meq/L (ref 135–145)
Total Bilirubin: 0.7 mg/dL (ref 0.2–1.2)
Total Protein: 6.7 g/dL (ref 6.0–8.3)

## 2023-10-24 LAB — CBC WITH DIFFERENTIAL/PLATELET
Basophils Absolute: 0 10*3/uL (ref 0.0–0.1)
Basophils Relative: 0.7 % (ref 0.0–3.0)
Eosinophils Absolute: 0.2 10*3/uL (ref 0.0–0.7)
Eosinophils Relative: 3.5 % (ref 0.0–5.0)
HCT: 44.5 % (ref 39.0–52.0)
Hemoglobin: 14.8 g/dL (ref 13.0–17.0)
Lymphocytes Relative: 40.7 % (ref 12.0–46.0)
Lymphs Abs: 1.8 10*3/uL (ref 0.7–4.0)
MCHC: 33.2 g/dL (ref 30.0–36.0)
MCV: 89 fl (ref 78.0–100.0)
Monocytes Absolute: 0.3 10*3/uL (ref 0.1–1.0)
Monocytes Relative: 7.8 % (ref 3.0–12.0)
Neutro Abs: 2.1 10*3/uL (ref 1.4–7.7)
Neutrophils Relative %: 47.3 % (ref 43.0–77.0)
Platelets: 220 10*3/uL (ref 150.0–400.0)
RBC: 5 Mil/uL (ref 4.22–5.81)
RDW: 12.7 % (ref 11.5–15.5)
WBC: 4.4 10*3/uL (ref 4.0–10.5)

## 2023-10-24 LAB — LIPID PANEL
Cholesterol: 183 mg/dL (ref 0–200)
HDL: 63.8 mg/dL (ref >39.00)
LDL Cholesterol: 107 mg/dL — ABNORMAL HIGH (ref 0–99)
NonHDL: 119.16
Total CHOL/HDL Ratio: 3
Triglycerides: 59 mg/dL (ref 0.0–149.0)
VLDL: 11.8 mg/dL (ref 0.0–40.0)

## 2023-10-24 LAB — HEMOGLOBIN A1C: Hgb A1c MFr Bld: 5.6 % (ref 4.6–6.5)

## 2023-10-24 LAB — TSH: TSH: 3.51 u[IU]/mL (ref 0.35–5.50)

## 2023-10-24 LAB — PSA: PSA: 0.45 ng/mL (ref 0.10–4.00)

## 2023-10-24 NOTE — Progress Notes (Signed)
 Subjective:  Patient ID: Dale Webster, male    DOB: 1975-11-20  Age: 48 y.o. MRN: [id number]  CC:  Chief Complaint  Patient presents with   Annual Exam    Pt is doing well, notes he is fasting    Health Maintenance    Patient does not get flu shot due to ill effect after declined one today, HIV and Hep C testing were both declined, patient notes has not had a COVID-19 vaccine the last year     HPI [name] presents for Annual Exam  PCP: me Derm: Dr. Langston Reusing, nevus removal in June 2024.  Prior tinea versicolor treated with Nizoral. Yearly follow up planned.   No chronic medications. No life or health changes.  [place] trip in August, good trip.   Borderline LDL previously, no meds.  LDL 98 last March.   Lab Results  Component Value Date   CHOL 180 10/21/2022   HDL 66.50 10/21/2022   LDLCALC 98 10/21/2022   TRIG 76.0 10/21/2022   CHOLHDL 3 10/21/2022   Lab Results  Component Value Date   ALT 16 10/21/2022   AST 19 10/21/2022   ALKPHOS 50 10/21/2022   BILITOT 0.7 10/21/2022   Work stress - increased past few months. Coping ok with stressors. Has handout on managing stress. He is exercising at [facility] with wife 3x/week. That has been helpful. Declines meds for depression. Has appt with therapist this morning.      10/24/2023    8:05 AM 10/21/2022    8:06 AM 10/17/2021    8:04 AM 04/18/2020   11:05 AM 02/03/2020    3:21 PM  Depression screen PHQ 2/9  Decreased Interest 1 0 1 0 0  Down, Depressed, Hopeless 1 0 1 0 0  PHQ - 2 Score 2 0 2 0 0  Altered sleeping 1 1 0    Tired, decreased energy 1 0 1    Change in appetite 1 0 0    Feeling bad or failure about yourself  1 0 1    Trouble concentrating 1 0 1    Moving slowly or fidgety/restless 0 0 0    Suicidal thoughts 0 0 0    PHQ-9 Score 7 1 5       Health Maintenance  Topic Date Due   HIV Screening  Never done   Hepatitis C Screening  Never done   INFLUENZA VACCINE  Never done   COVID-19 Vaccine (4 -  2024-25 season) 04/13/2023   Colonoscopy  10/23/2024   DTaP/Tdap/Td (3 - Td or Tdap) 10/20/2032   HPV VACCINES  Aged Out  Negative Cologuard 10/23/2021, repeat 3 years. Prostate: does  have family history of prostate cancer, father. The natural history of prostate cancer and ongoing controversy regarding screening and potential treatment outcomes of prostate cancer has been discussed with the patient. The meaning of a false positive PSA and a false negative PSA has been discussed. He indicates understanding of the limitations of this screening test and wishes to proceed with screening PSA testing. Lab Results  Component Value Date   PSA1 2.6 05/30/2020   PSA1 3.1 04/18/2020   PSA1 0.5 02/03/2020   PSA 0.53 10/21/2022   PSA 0.28 10/17/2021      Immunization History  Administered Date(s) Administered   PFIZER(Purple Top)SARS-COV-2 Vaccination 10/26/2019, 11/23/2019, 07/28/2020   Tdap 09/01/2011, 10/21/2022  Declines flu, COVID vaccines.  Tdap updated last year. Declines HIV/hep C screening.  No results found. Glasses - optho -  due. 2-2.5 yrs ago. Plans to schedule with new optho. [facility].   Dental: every 6 months   Alcohol: 1 beer per day, 1-2 bourbons - plans to cut back, plans to d/w  therapist on other coping techniques.   Tobacco: none  Exercise: gym 3 days per week - 30-33min. Physical job. Both cardio and resistance. Occasional sweaty palms - anxiety, stress related.  Acupuncture for back issues - effective.    History Patient Active Problem List   Diagnosis Date Noted   Urinary tract bacterial infections 04/12/2020   History reviewed. No pertinent past medical history. Past Surgical History:  Procedure Laterality Date   mole biopsy  02/2023   benign   No Known Allergies Prior to Admission medications   Medication Sig Start Date End Date Taking? Authorizing Provider  ketoconazole (NIZORAL) 2 % cream Apply to affected areas twice a day for 1 month  then once a month as maintenance Patient taking differently: Apply 1 Application topically daily. Apply to affected areas twice a day for 1 month then once a month as maintenance 01/14/23  Yes Terri Piedra, DO   Social History   Socioeconomic History   Marital status: Married    Spouse name: Not on file   Number of children: Not on file   Years of education: Not on file   Highest education level: Not on file  Occupational History   Occupation: energy analyst  Tobacco Use   Smoking status: Never   Smokeless tobacco: Never  Vaping Use   Vaping status: Never Used  Substance and Sexual Activity   Alcohol use: Yes    Alcohol/week: 7.0 standard drinks of alcohol    Types: 7 Cans of beer per week    Comment: 1 beer a day average   Drug use: No   Sexual activity: Yes  Other Topics Concern   Not on file  Social History Narrative   Not on file   Social Drivers of Health   Financial Resource Strain: Not on file  Food Insecurity: Not on file  Transportation Needs: Not on file  Physical Activity: Not on file  Stress: Not on file  Social Connections: Not on file  Intimate Partner Violence: Not on file    Review of Systems 13 point review of systems per patient health survey noted.  Negative other than as indicated above or in HPI.    Objective:   Vitals:   10/24/23 0811  BP: 122/64  Pulse: 72  Temp: 98.7 F (37.1 C)  TempSrc: Temporal  SpO2: 98%  Weight: 154 lb 9.6 oz (70.1 kg)  Height: 5' 8.75" (1.746 m)     Physical Exam Vitals reviewed.  Constitutional:      Appearance: He is well-developed.  HENT:     Head: Normocephalic and atraumatic.     Right Ear: External ear normal.     Left Ear: External ear normal.  Eyes:     Conjunctiva/sclera: Conjunctivae normal.     Pupils: Pupils are equal, round, and reactive to light.  Neck:     Thyroid: No thyromegaly.  Cardiovascular:     Rate and Rhythm: Normal rate and regular rhythm.     Heart sounds: Normal  heart sounds.  Pulmonary:     Effort: Pulmonary effort is normal. No respiratory distress.     Breath sounds: Normal breath sounds. No wheezing.  Abdominal:     General: There is no distension.     Palpations: Abdomen is soft.  Tenderness: There is no abdominal tenderness.  Musculoskeletal:        General: No tenderness. Normal range of motion.     Cervical back: Normal range of motion and neck supple.  Lymphadenopathy:     Cervical: No cervical adenopathy.  Skin:    General: Skin is warm and dry.  Neurological:     Mental Status: He is alert and oriented to person, place, and time.     Deep Tendon Reflexes: Reflexes are normal and symmetric.  Psychiatric:        Behavior: Behavior normal.        Assessment & Plan:  Dantae Meunier is a 48 y.o. male . Annual physical exam - Plan: Comprehensive metabolic panel, Hemoglobin A1c, Lipid panel, CBC with Differential/Platelet, PSA, TSH  - -anticipatory guidance as below in AVS, screening labs above. Health maintenance items as above in HPI discussed/recommended as applicable.   -Situational stressors as above.  Declines medications at this time, is planning on meeting with therapist this morning.  Expect that to be helpful for assessing current coping techniques and discussion of other coping techniques.  Commended on exercise.  Goal of decreasing alcohol use but suspect that is currently slightly higher due to stressors as above.  Anticipate this will decrease with improved stress management.  RTC precautions given.  Screening for diabetes mellitus - Plan: Comprehensive metabolic panel, Hemoglobin A1c  Screening, anemia, deficiency, iron - Plan: CBC with Differential/Platelet  Screening for prostate cancer - Plan: PSA  Screening for hyperlipidemia - Plan: Comprehensive metabolic panel, Lipid panel  Screening for thyroid disorder - Plan: TSH   No orders of the defined types were placed in this encounter.  Patient Instructions   Thanks for coming in today.  Continue with the stressors.  Meeting with the life coach, therapist should be helpful to look at other coping techniques.  Cutting back on alcohol is recommended but I understand that may be helping with coping at this time.  Again other coping techniques may help with that plan.  If any concerns on labs I will let you know.  No new meds for now.  Keep up the good work with exercise.  Follow-up in 1 year for physical, sooner if any new symptoms or other concerns.  Take care!  Preventive Care 52-69 Years Old, Male Preventive care refers to lifestyle choices and visits with your health care provider that can promote health and wellness. Preventive care visits are also called wellness exams. What can I expect for my preventive care visit? Counseling During your preventive care visit, your health care provider may ask about your: Medical history, including: Past medical problems. Family medical history. Current health, including: Emotional well-being. Home life and relationship well-being. Sexual activity. Lifestyle, including: Alcohol, nicotine or tobacco, and drug use. Access to firearms. Diet, exercise, and sleep habits. Safety issues such as seatbelt and bike helmet use. Sunscreen use. Work and work Astronomer. Physical exam Your health care provider will check your: Height and weight. These may be used to calculate your BMI (body mass index). BMI is a measurement that tells if you are at a healthy weight. Waist circumference. This measures the distance around your waistline. This measurement also tells if you are at a healthy weight and may help predict your risk of certain diseases, such as type 2 diabetes and high blood pressure. Heart rate and blood pressure. Body temperature. Skin for abnormal spots. What immunizations do I need?  Vaccines are usually given at various ages,  according to a schedule. Your health care provider will recommend vaccines  for you based on your age, medical history, and lifestyle or other factors, such as travel or where you work. What tests do I need? Screening Your health care provider may recommend screening tests for certain conditions. This may include: Lipid and cholesterol levels. Diabetes screening. This is done by checking your blood sugar (glucose) after you have not eaten for a while (fasting). Hepatitis B test. Hepatitis C test. HIV (human immunodeficiency virus) test. STI (sexually transmitted infection) testing, if you are at risk. Lung cancer screening. Prostate cancer screening. Colorectal cancer screening. Talk with your health care provider about your test results, treatment options, and if necessary, the need for more tests. Follow these instructions at home: Eating and drinking  Eat a diet that includes fresh fruits and vegetables, whole grains, lean protein, and low-fat dairy products. Take vitamin and mineral supplements as recommended by your health care provider. Do not drink alcohol if your health care provider tells you not to drink. If you drink alcohol: Limit how much you have to 0-2 drinks a day. Know how much alcohol is in your drink. In the [place] one drink equals one 12 oz bottle of beer (355 mL), one 5 oz glass of wine (148 mL), or one 1 oz glass of hard liquor (44 mL). Lifestyle Brush your teeth every morning and night with fluoride toothpaste. Floss one time each day. Exercise for at least 30 minutes 5 or more days each week. Do not use any products that contain nicotine or tobacco. These products include cigarettes, chewing tobacco, and vaping devices, such as e-cigarettes. If you need help quitting, ask your health care provider. Do not use drugs. If you are sexually active, practice safe sex. Use a condom or other form of protection to prevent STIs. Take aspirin only as told by your health care provider. Make sure that you understand how much to take and what form to  take. Work with your health care provider to find out whether it is safe and beneficial for you to take aspirin daily. Find healthy ways to manage stress, such as: Meditation, yoga, or listening to music. Journaling. Talking to a trusted person. Spending time with friends and family. Minimize exposure to UV radiation to reduce your risk of skin cancer. Safety Always wear your seat belt while driving or riding in a vehicle. Do not drive: If you have been drinking alcohol. Do not ride with someone who has been drinking. When you are tired or distracted. While texting. If you have been using any mind-altering substances or drugs. Wear a helmet and other protective equipment during sports activities. If you have firearms in your house, make sure you follow all gun safety procedures. What's next? Go to your health care provider once a year for an annual wellness visit. Ask your health care provider how often you should have your eyes and teeth checked. Stay up to date on all vaccines. This information is not intended to replace advice given to you by your health care provider. Make sure you discuss any questions you have with your health care provider. Document Revised: 01/24/2021 Document Reviewed: 01/24/2021 Elsevier Patient Education  2024 Elsevier Inc.    Signed,   Meredith Staggers, MD  Primary Care, [facility] 10/24/23 8:42 AM

## 2023-10-24 NOTE — Patient Instructions (Signed)
 Thanks for coming in today.  Continue with the stressors.  Meeting with the life coach, therapist should be helpful to look at other coping techniques.  Cutting back on alcohol is recommended but I understand that may be helping with coping at this time.  Again other coping techniques may help with that plan.  If any concerns on labs I will let you know.  No new meds for now.  Keep up the good work with exercise.  Follow-up in 1 year for physical, sooner if any new symptoms or other concerns.  Take care!  Preventive Care 47-47 Years Old, Male Preventive care refers to lifestyle choices and visits with your health care provider that can promote health and wellness. Preventive care visits are also called wellness exams. What can I expect for my preventive care visit? Counseling During your preventive care visit, your health care provider may ask about your: Medical history, including: Past medical problems. Family medical history. Current health, including: Emotional well-being. Home life and relationship well-being. Sexual activity. Lifestyle, including: Alcohol, nicotine or tobacco, and drug use. Access to firearms. Diet, exercise, and sleep habits. Safety issues such as seatbelt and bike helmet use. Sunscreen use. Work and work Astronomer. Physical exam Your health care provider will check your: Height and weight. These may be used to calculate your BMI (body mass index). BMI is a measurement that tells if you are at a healthy weight. Waist circumference. This measures the distance around your waistline. This measurement also tells if you are at a healthy weight and may help predict your risk of certain diseases, such as type 2 diabetes and high blood pressure. Heart rate and blood pressure. Body temperature. Skin for abnormal spots. What immunizations do I need?  Vaccines are usually given at various ages, according to a schedule. Your health care provider will recommend vaccines for  you based on your age, medical history, and lifestyle or other factors, such as travel or where you work. What tests do I need? Screening Your health care provider may recommend screening tests for certain conditions. This may include: Lipid and cholesterol levels. Diabetes screening. This is done by checking your blood sugar (glucose) after you have not eaten for a while (fasting). Hepatitis B test. Hepatitis C test. HIV (human immunodeficiency virus) test. STI (sexually transmitted infection) testing, if you are at risk. Lung cancer screening. Prostate cancer screening. Colorectal cancer screening. Talk with your health care provider about your test results, treatment options, and if necessary, the need for more tests. Follow these instructions at home: Eating and drinking  Eat a diet that includes fresh fruits and vegetables, whole grains, lean protein, and low-fat dairy products. Take vitamin and mineral supplements as recommended by your health care provider. Do not drink alcohol if your health care provider tells you not to drink. If you drink alcohol: Limit how much you have to 0-2 drinks a day. Know how much alcohol is in your drink. In the [place] one drink equals one 12 oz bottle of beer (355 mL), one 5 oz glass of wine (148 mL), or one 1 oz glass of hard liquor (44 mL). Lifestyle Brush your teeth every morning and night with fluoride toothpaste. Floss one time each day. Exercise for at least 30 minutes 5 or more days each week. Do not use any products that contain nicotine or tobacco. These products include cigarettes, chewing tobacco, and vaping devices, such as e-cigarettes. If you need help quitting, ask your health care provider. Do not  use drugs. If you are sexually active, practice safe sex. Use a condom or other form of protection to prevent STIs. Take aspirin only as told by your health care provider. Make sure that you understand how much to take and what form to take.  Work with your health care provider to find out whether it is safe and beneficial for you to take aspirin daily. Find healthy ways to manage stress, such as: Meditation, yoga, or listening to music. Journaling. Talking to a trusted person. Spending time with friends and family. Minimize exposure to UV radiation to reduce your risk of skin cancer. Safety Always wear your seat belt while driving or riding in a vehicle. Do not drive: If you have been drinking alcohol. Do not ride with someone who has been drinking. When you are tired or distracted. While texting. If you have been using any mind-altering substances or drugs. Wear a helmet and other protective equipment during sports activities. If you have firearms in your house, make sure you follow all gun safety procedures. What's next? Go to your health care provider once a year for an annual wellness visit. Ask your health care provider how often you should have your eyes and teeth checked. Stay up to date on all vaccines. This information is not intended to replace advice given to you by your health care provider. Make sure you discuss any questions you have with your health care provider. Document Revised: 01/24/2021 Document Reviewed: 01/24/2021 Elsevier Patient Education  2024 [employer].

## 2023-10-28 ENCOUNTER — Encounter: Payer: Self-pay | Admitting: Family Medicine

## 2024-01-15 ENCOUNTER — Encounter: Payer: Self-pay | Admitting: Dermatology

## 2024-01-15 ENCOUNTER — Ambulatory Visit: Payer: Commercial Managed Care - PPO | Admitting: Dermatology

## 2024-01-15 VITALS — BP 125/93 | HR 83

## 2024-01-15 DIAGNOSIS — B360 Pityriasis versicolor: Secondary | ICD-10-CM

## 2024-01-15 DIAGNOSIS — Z1283 Encounter for screening for malignant neoplasm of skin: Secondary | ICD-10-CM | POA: Diagnosis not present

## 2024-01-15 DIAGNOSIS — W908XXA Exposure to other nonionizing radiation, initial encounter: Secondary | ICD-10-CM

## 2024-01-15 DIAGNOSIS — D492 Neoplasm of unspecified behavior of bone, soft tissue, and skin: Secondary | ICD-10-CM

## 2024-01-15 DIAGNOSIS — D485 Neoplasm of uncertain behavior of skin: Secondary | ICD-10-CM

## 2024-01-15 DIAGNOSIS — B351 Tinea unguium: Secondary | ICD-10-CM | POA: Diagnosis not present

## 2024-01-15 DIAGNOSIS — Z86018 Personal history of other benign neoplasm: Secondary | ICD-10-CM

## 2024-01-15 DIAGNOSIS — L814 Other melanin hyperpigmentation: Secondary | ICD-10-CM

## 2024-01-15 DIAGNOSIS — D1801 Hemangioma of skin and subcutaneous tissue: Secondary | ICD-10-CM

## 2024-01-15 DIAGNOSIS — L578 Other skin changes due to chronic exposure to nonionizing radiation: Secondary | ICD-10-CM

## 2024-01-15 DIAGNOSIS — L821 Other seborrheic keratosis: Secondary | ICD-10-CM

## 2024-01-15 DIAGNOSIS — D229 Melanocytic nevi, unspecified: Secondary | ICD-10-CM

## 2024-01-15 DIAGNOSIS — D2271 Melanocytic nevi of right lower limb, including hip: Secondary | ICD-10-CM

## 2024-01-15 DIAGNOSIS — L7211 Pilar cyst: Secondary | ICD-10-CM

## 2024-01-15 MED ORDER — JUBLIA 10 % EX SOLN: 1.0000 | Freq: Every evening | CUTANEOUS | 11 refills | Status: AC

## 2024-01-15 NOTE — Patient Instructions (Signed)

## 2024-01-15 NOTE — Progress Notes (Signed)
 Total Body Skin Exam (TBSE) Visit   Subjective  Dale Webster is a 48 y.o. male who presents for the following: Skin Cancer Screening and Full Body Skin Exam  Patient presents today for follow up visit for TBSE. Patient was last evaluated on 02/06/23 . Patient denies medication changes. Patient reports he  does have spots, moles and lesions of concern to be evaluated. Patient reports throughout his lifetime he  has had moderate sun exposure. Currently, patient reports if he  has excessive sun exposure, he does not apply sunscreen and/or wears protective coverings. 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 Patient reports  family history of skin cancers. The patient has spots, moles and lesions to be evaluated, some may be new or changing and the patient has concerns that these could be cancer.  The following portions of the chart were reviewed this encounter and updated as appropriate: medications, allergies, medical history  Review of Systems:  No other skin or systemic complaints except as noted in HPI or Assessment and Plan.  Objective  Well appearing patient in no apparent distress; mood and affect are within normal limits.  A full examination was performed including scalp, head, eyes, ears, nose, lips, neck, chest, axillae, abdomen, back, buttocks, bilateral upper extremities, bilateral lower extremities, hands, feet, fingers, toes, fingernails, and toenails. All findings within normal limits unless otherwise noted below.   Relevant physical exam findings are noted in the Assessment and Plan.    Assessment & Plan   LENTIGINES, SEBORRHEIC KERATOSES, HEMANGIOMAS - Benign normal skin lesions - Benign-appearing - Call for any changes  MELANOCYTIC NEVI - Tan-brown and/or pink-flesh-colored symmetric macules and papules - Benign appearing on exam today - Observation - Call clinic for new or changing moles - Recommend daily use of broad spectrum spf 30+ sunscreen  to sun-exposed areas.   ACTINIC DAMAGE - Chronic condition, secondary to cumulative UV/sun exposure - diffuse scaly erythematous macules with underlying dyspigmentation - Recommend daily broad spectrum sunscreen SPF 30+ to sun-exposed areas, reapply every 2 hours as needed.  - Staying in the shade or wearing long sleeves, sun glasses (UVA+UVB protection) and wide brim hats (4-inch brim around the entire circumference of the hat) are also recommended for sun protection.  - Call for new or changing lesions.  History of Dysplastic Nevi - No evidence of recurrence today - Recommend regular full body skin exams - Recommend daily broad spectrum sunscreen SPF 30+ to sun-exposed areas, reapply every 2 hours as needed.  - Call if any new or changing lesions are noted between office visits  Pilar Cyst Exam: Subcutaneous nodule.  Benign-appearing. Exam most consistent with a pilar cyst. Discussed that a cyst is a benign growth that can grow over time and sometimes get irritated or inflamed. Recommend observation if it is not bothersome. Discussed option of surgical excision to remove it if it is growing, symptomatic, or other changes noted. Please call for new or changing lesions so they can be evaluated.  Tinea Versicolor  Flared, pink salmon colored patches on chest and arms  Apply Ketoconazole  cream twice a day as needed until clear. Advised it can take many months for affected areas to repigment.     ONYCHOMYCOSIS Exam: Thickened toenails with subungal debris c/w onychomycosis  Treatment Plan: - Prescribed Jublia to apply daily - Patient educated that the Toenails will grow out over a years time   SKIN CANCER SCREENING PERFORMED TODAY.  in web space between 3rd  and 4th toes on Right Foot  No follow-ups on file.  I, Jetta Ager, am acting as Neurosurgeon for [employer], DO.  Documentation: I have reviewed the above documentation for accuracy and completeness, and I agree with the  above.  Louana Roup, DO

## 2024-01-16 LAB — SURGICAL PATHOLOGY

## 2024-01-29 ENCOUNTER — Ambulatory Visit: Payer: Self-pay | Admitting: Dermatology

## 2024-03-03 ENCOUNTER — Ambulatory Visit (INDEPENDENT_AMBULATORY_CARE_PROVIDER_SITE_OTHER): Admitting: Orthopedic Surgery

## 2024-03-03 ENCOUNTER — Other Ambulatory Visit (INDEPENDENT_AMBULATORY_CARE_PROVIDER_SITE_OTHER)

## 2024-03-03 DIAGNOSIS — M25561 Pain in right knee: Secondary | ICD-10-CM | POA: Diagnosis not present

## 2024-03-04 ENCOUNTER — Encounter: Payer: Self-pay | Admitting: Orthopedic Surgery

## 2024-03-04 NOTE — Progress Notes (Signed)
 Office Visit Note   Patient: Dale Webster           Date of Birth: [birth year]           MRN: [id number] Visit Date: 03/03/2024 Requested by: Levora Reyes SAUNDERS, MD [id number] [place],  [place] [address] PCP: Levora Reyes SAUNDERS, MD  Subjective: Chief Complaint  Patient presents with   Right Knee - Pain    HPI: Dale Webster is a 48 y.o. male who presents to the office reporting right knee pain of 3 months duration.  Does not really recall specific injury.  Potentially happen when he was calling in a crawl space under at home.  He does energy deficiency evaluations for homes.  That involves going up and down ladders as well as getting into and out of attics in crawl spaces.  He plays the saxophone.  Likes to workout at the gym doing the elliptical but that does bother him some.  Crouching also is painful for him in that right knee.  Does describe some occasional popping which is new.  It is getting better.  Denies any discrete swelling.  The pain is on the medial aspect of the knee.  Ibuprofen helps with some relief.  He also has tried some collagen for the past week as a supplement which has helped..                ROS: All systems reviewed are negative as they relate to the chief complaint within the history of present illness.  Patient denies fevers or chills.  Assessment & Plan: Visit Diagnoses:  1. Right knee pain, unspecified chronicity     Plan: Impression is normal radiographs and no effusion in the right knee.  Could be an early degenerative meniscal tear.  Collateral cruciate ligaments are stable.  Several options are possible including continuing with anti-inflammatories and the collagen versus injection versus MRI scanning.  I think with 3 months of symptoms it would not be unreasonable to consider MRI scanning in this patient to does have a high level of fitness.  For now since this is on and improving have we will hold off on intervention.  If symptoms change in terms of more  mechanical type symptoms or if the knee starts to have significant swelling which restricts his terminal flexion then we could consider an injection and/or MRI imaging at that time.  He will let us  know.  Follow-Up Instructions: No follow-ups on file.   Orders:  Orders Placed This Encounter  Procedures   XR KNEE 3 VIEW RIGHT   No orders of the defined types were placed in this encounter.     Procedures: No procedures performed   Clinical Data: No additional findings.  Objective: Vital Signs: There were no vitals taken for this visit.  Physical Exam:  Constitutional: Patient appears well-developed HEENT:  Head: Normocephalic Eyes:EOM are normal Neck: Normal range of motion Cardiovascular: Normal rate Pulmonary/chest: Effort normal Neurologic: Patient is alert Skin: Skin is warm Psychiatric: Patient has normal mood and affect  Ortho Exam: Ortho exam demonstrates normal gait alignment.  Excellent quad strength and tone bilaterally.  Full range of motion both knees.  Does have slight medial joint line tenderness on the right compared to the left.  Equivocal McMurray compression testing on the right for medial compartment pathology.  Collateral and cruciate ligaments are stable.  Minimal to no patellofemoral crepitus bilaterally.  No groin pain with internal/external Tatian leg.  Pedal pulses palpable.  No other masses lymphadenopathy or skin changes noted in the right knee region.  Specialty Comments:  No specialty comments available.  Imaging: XR KNEE 3 VIEW RIGHT Result Date: 03/04/2024 AP lateral merchant radiographs of the right knee are reviewed.  No acute fracture.  No dislocation.  Alignment normal.  No significant degenerative changes in the medial lateral or patellofemoral compartments.     PMFS History: There are no active problems to display for this patient.  History reviewed. No pertinent past medical history.  Family History  Problem Relation Age of Onset    Melanoma Sister    Cancer Father     Past Surgical History:  Procedure Laterality Date   mole biopsy  02/2023   benign   Social History   Occupational History   Occupation: Advertising account planner  Tobacco Use   Smoking status: Never    Passive exposure: Never   Smokeless tobacco: Never  Vaping Use   Vaping status: Never Used  Substance and Sexual Activity   Alcohol use: Yes    Alcohol/week: 7.0 standard drinks of alcohol    Types: 7 Cans of beer per week    Comment: 1 beer a day average   Drug use: No   Sexual activity: Yes

## 2024-06-14 ENCOUNTER — Encounter: Payer: Self-pay | Admitting: Radiology

## 2024-07-13 ENCOUNTER — Ambulatory Visit (INDEPENDENT_AMBULATORY_CARE_PROVIDER_SITE_OTHER)

## 2024-07-13 ENCOUNTER — Ambulatory Visit: Payer: Self-pay | Admitting: Urgent Care

## 2024-07-13 ENCOUNTER — Ambulatory Visit
Admission: EM | Admit: 2024-07-13 | Discharge: 2024-07-13 | Disposition: A | Discharge status: Home / Self Care | Attending: Family Medicine | Admitting: Family Medicine

## 2024-07-13 DIAGNOSIS — S298XXA Other specified injuries of thorax, initial encounter: Secondary | ICD-10-CM

## 2024-07-13 DIAGNOSIS — R0789 Other chest pain: Secondary | ICD-10-CM | POA: Diagnosis not present

## 2024-07-13 MED ORDER — NAPROXEN 500 MG PO TABS: 500.0000 mg | ORAL_TABLET | Freq: Two times a day (BID) | ORAL | 0 refills | Status: AC

## 2024-07-13 MED ORDER — CYCLOBENZAPRINE HCL 5 MG PO TABS: 5.0000 mg | ORAL_TABLET | Freq: Every evening | ORAL | 0 refills | Status: AC | PRN

## 2024-07-13 NOTE — Discharge Instructions (Addendum)
 Will update you with your x-ray results later today. For now use naproxen for pain and inflammation. Use a muscle relaxant, cyclobenzaprine at bedtime.

## 2024-07-13 NOTE — ED Provider Notes (Signed)
 [facility]  Note:  This document was prepared using Conservation officer, historic buildings and may include unintentional dictation errors.  MRN: [id number] DOB: May 30, 1976  Subjective:   Dale Webster is a 48 y.o. male presenting for 1 day history of suffering an accidental fall.  Has since had right sided rib pain.  Patient was in the shower and fell against the bathtub.  Has had mild to moderate persistent pain that makes it difficult to take a deep breath, laugh or sneeze.  No medications taken for his symptoms.  Prefers to avoid them.  No current facility-administered medications for this encounter.  Current Outpatient Medications:    Efinaconazole  (JUBLIA ) 10 % SOLN, Apply 1 Application topically at bedtime., Disp: 4 mL, Rfl: 11   ketoconazole  (NIZORAL ) 2 % cream, Apply to affected areas twice a day for 1 month then once a month as maintenance (Patient taking differently: Apply 1 Application topically daily. Apply to affected areas twice a day for 1 month then once a month as maintenance), Disp: 60 g, Rfl: 11   No Known Allergies  History reviewed. No pertinent past medical history.   Past Surgical History:  Procedure Laterality Date   mole biopsy  02/2023   benign    Family History  Problem Relation Age of Onset   Melanoma Sister    Cancer Father     Social History   Tobacco Use   Smoking status: Never    Passive exposure: Never   Smokeless tobacco: Never  Vaping Use   Vaping status: Never Used  Substance Use Topics   Alcohol use: Yes    Alcohol/week: 7.0 standard drinks of alcohol    Types: 7 Cans of beer per week    Comment: 1 beer a day average   Drug use: No    ROS   Objective:   Vitals: BP (!) 149/77 (BP Location: Left Arm)   Pulse 86   Temp 97.6 F (36.4 C) (Oral)   Resp 16   SpO2 96%   Physical Exam Constitutional:      General: He is not in acute distress.    Appearance: Normal appearance. He is well-developed and  normal weight. He is not ill-appearing, toxic-appearing or diaphoretic.  HENT:     Head: Normocephalic and atraumatic.     Right Ear: External ear normal.     Left Ear: External ear normal.     Nose: Nose normal.     Mouth/Throat:     Mouth: Mucous membranes are moist.  Eyes:     General: No scleral icterus.       Right eye: No discharge.        Left eye: No discharge.     Extraocular Movements: Extraocular movements intact.  Cardiovascular:     Rate and Rhythm: Normal rate and regular rhythm.     Heart sounds: Normal heart sounds. No murmur heard.    No friction rub. No gallop.  Pulmonary:     Effort: Pulmonary effort is normal. No respiratory distress.     Breath sounds: Normal breath sounds. No stridor. No wheezing, rhonchi or rales.  Chest:     Chest wall: Tenderness (right anterior lower and lateral chest wall) present.  Musculoskeletal:     Cervical back: Normal range of motion.  Neurological:     Mental Status: He is alert and oriented to person, place, and time.  Psychiatric:        Mood and Affect: Mood normal.  Behavior: Behavior normal.        Thought Content: Thought content normal.        Judgment: Judgment normal.     Assessment and Plan :   I have reviewed the PDMP during this encounter.  1. Rib pain on right side   2. Rib contusion, right, initial encounter    X-ray over-read was pending at time of discharge, recommended follow up with only abnormal results. Otherwise will not call for negative over-read. Patient was in agreement.  Recommended using naproxen and cyclobenzaprine for his chest wall pain.  Counseled patient on potential for adverse effects with medications prescribed/recommended today, ER and return-to-clinic precautions discussed, patient verbalized understanding.    Christopher Savannah, [place] 07/13/24 1207

## 2024-07-13 NOTE — ED Triage Notes (Signed)
 Pt reports right sided rib cage pain after he fell on the bathroom last night. Pain is worse with a shallow breath, sneezing or laugh.  Pt has not taken any meds for complaint.

## 2024-10-22 ENCOUNTER — Encounter: Admitting: Family Medicine

## 2025-01-18 ENCOUNTER — Ambulatory Visit: Admitting: Dermatology
